# Patient Record
Sex: Female | Born: 1935 | Race: White | Hispanic: No | Marital: Married
Health system: Southern US, Community
[De-identification: ages and names within clinical notes are randomized; demographics above are authoritative.]

---

## 2003-03-29 ENCOUNTER — Encounter: Admission: RE | Admit: 2003-03-29 | Discharge: 2003-03-29 | Payer: Self-pay | Admitting: Internal Medicine

## 2003-03-29 ENCOUNTER — Encounter: Payer: Self-pay | Admitting: Internal Medicine

## 2005-12-06 ENCOUNTER — Emergency Department (HOSPITAL_COMMUNITY): Admission: EM | Admit: 2005-12-06 | Discharge: 2005-12-06 | Payer: Self-pay | Admitting: Family Medicine

## 2009-05-17 ENCOUNTER — Encounter: Admission: RE | Admit: 2009-05-17 | Discharge: 2009-05-17 | Payer: Self-pay | Admitting: Surgery

## 2009-05-21 ENCOUNTER — Encounter: Admission: RE | Admit: 2009-05-21 | Discharge: 2009-05-21 | Payer: Self-pay | Admitting: Surgery

## 2009-11-27 ENCOUNTER — Emergency Department (HOSPITAL_COMMUNITY): Admission: EM | Admit: 2009-11-27 | Discharge: 2009-11-27 | Payer: Self-pay | Admitting: Family Medicine

## 2009-12-07 ENCOUNTER — Emergency Department (HOSPITAL_COMMUNITY): Admission: EM | Admit: 2009-12-07 | Discharge: 2009-12-07 | Payer: Self-pay | Admitting: Emergency Medicine

## 2011-06-15 ENCOUNTER — Emergency Department (HOSPITAL_COMMUNITY): Payer: Medicare Other

## 2011-06-15 ENCOUNTER — Inpatient Hospital Stay (INDEPENDENT_AMBULATORY_CARE_PROVIDER_SITE_OTHER)
Admission: RE | Admit: 2011-06-15 | Discharge: 2011-06-15 | Disposition: A | Discharge status: Home / Self Care | Payer: Medicare Other | Source: Ambulatory Visit | Attending: Emergency Medicine | Admitting: Emergency Medicine

## 2011-06-15 ENCOUNTER — Emergency Department (HOSPITAL_COMMUNITY)
Admission: EM | Admit: 2011-06-15 | Discharge: 2011-06-15 | Disposition: A | Discharge status: Home / Self Care | Payer: Medicare Other | Attending: Emergency Medicine | Admitting: Emergency Medicine

## 2011-06-15 DIAGNOSIS — Z79899 Other long term (current) drug therapy: Secondary | ICD-10-CM | POA: Insufficient documentation

## 2011-06-15 DIAGNOSIS — R51 Headache: Secondary | ICD-10-CM | POA: Insufficient documentation

## 2011-06-15 DIAGNOSIS — W19XXXA Unspecified fall, initial encounter: Secondary | ICD-10-CM

## 2011-06-15 DIAGNOSIS — M810 Age-related osteoporosis without current pathological fracture: Secondary | ICD-10-CM | POA: Insufficient documentation

## 2011-06-15 DIAGNOSIS — S0180XA Unspecified open wound of other part of head, initial encounter: Secondary | ICD-10-CM

## 2011-06-15 DIAGNOSIS — E039 Hypothyroidism, unspecified: Secondary | ICD-10-CM | POA: Insufficient documentation

## 2011-06-15 DIAGNOSIS — S0990XA Unspecified injury of head, initial encounter: Secondary | ICD-10-CM | POA: Insufficient documentation

## 2011-06-15 DIAGNOSIS — W010XXA Fall on same level from slipping, tripping and stumbling without subsequent striking against object, initial encounter: Secondary | ICD-10-CM | POA: Insufficient documentation

## 2011-06-20 ENCOUNTER — Inpatient Hospital Stay (INDEPENDENT_AMBULATORY_CARE_PROVIDER_SITE_OTHER)
Admission: RE | Admit: 2011-06-20 | Discharge: 2011-06-20 | Disposition: A | Discharge status: Home / Self Care | Payer: Medicare Other | Source: Ambulatory Visit | Attending: Family Medicine | Admitting: Family Medicine

## 2011-06-20 DIAGNOSIS — S0180XA Unspecified open wound of other part of head, initial encounter: Secondary | ICD-10-CM

## 2016-12-05 ENCOUNTER — Inpatient Hospital Stay (HOSPITAL_COMMUNITY)
Admission: EM | Admit: 2016-12-05 | Discharge: 2016-12-09 | DRG: 064 | Disposition: A | Discharge status: Hospice | Payer: Medicare Other | Attending: Internal Medicine | Admitting: Internal Medicine

## 2016-12-05 ENCOUNTER — Emergency Department (HOSPITAL_COMMUNITY): Payer: Medicare Other

## 2016-12-05 DIAGNOSIS — I612 Nontraumatic intracerebral hemorrhage in hemisphere, unspecified: Secondary | ICD-10-CM | POA: Diagnosis not present

## 2016-12-05 DIAGNOSIS — R29810 Facial weakness: Secondary | ICD-10-CM | POA: Diagnosis not present

## 2016-12-05 DIAGNOSIS — Z885 Allergy status to narcotic agent status: Secondary | ICD-10-CM

## 2016-12-05 DIAGNOSIS — Z79899 Other long term (current) drug therapy: Secondary | ICD-10-CM

## 2016-12-05 DIAGNOSIS — Z515 Encounter for palliative care: Secondary | ICD-10-CM

## 2016-12-05 DIAGNOSIS — Z66 Do not resuscitate: Secondary | ICD-10-CM | POA: Diagnosis present

## 2016-12-05 DIAGNOSIS — I639 Cerebral infarction, unspecified: Secondary | ICD-10-CM | POA: Diagnosis present

## 2016-12-05 DIAGNOSIS — I615 Nontraumatic intracerebral hemorrhage, intraventricular: Principal | ICD-10-CM | POA: Diagnosis present

## 2016-12-05 DIAGNOSIS — G8194 Hemiplegia, unspecified affecting left nondominant side: Secondary | ICD-10-CM | POA: Diagnosis present

## 2016-12-05 DIAGNOSIS — E039 Hypothyroidism, unspecified: Secondary | ICD-10-CM

## 2016-12-05 DIAGNOSIS — S0689AA Other specified intracranial injury with loss of consciousness status unknown, initial encounter: Secondary | ICD-10-CM | POA: Diagnosis present

## 2016-12-05 DIAGNOSIS — Y92007 Garden or yard of unspecified non-institutional (private) residence as the place of occurrence of the external cause: Secondary | ICD-10-CM

## 2016-12-05 DIAGNOSIS — I619 Nontraumatic intracerebral hemorrhage, unspecified: Secondary | ICD-10-CM | POA: Diagnosis present

## 2016-12-05 DIAGNOSIS — I10 Essential (primary) hypertension: Secondary | ICD-10-CM | POA: Diagnosis present

## 2016-12-05 DIAGNOSIS — R29716 NIHSS score 16: Secondary | ICD-10-CM | POA: Diagnosis present

## 2016-12-05 DIAGNOSIS — W1830XA Fall on same level, unspecified, initial encounter: Secondary | ICD-10-CM | POA: Diagnosis present

## 2016-12-05 DIAGNOSIS — S06369A Traumatic hemorrhage of cerebrum, unspecified, with loss of consciousness of unspecified duration, initial encounter: Secondary | ICD-10-CM

## 2016-12-05 LAB — DIFFERENTIAL
Basophils Absolute: 0 10*3/uL (ref 0.0–0.1)
Basophils Relative: 0 %
EOS PCT: 0 %
Eosinophils Absolute: 0 10*3/uL (ref 0.0–0.7)
LYMPHS ABS: 0.7 10*3/uL (ref 0.7–4.0)
LYMPHS PCT: 7 %
MONO ABS: 0.5 10*3/uL (ref 0.1–1.0)
MONOS PCT: 5 %
Neutro Abs: 9.3 10*3/uL — ABNORMAL HIGH (ref 1.7–7.7)
Neutrophils Relative %: 88 %

## 2016-12-05 LAB — COMPREHENSIVE METABOLIC PANEL
ALK PHOS: 51 U/L (ref 38–126)
ALT: 14 U/L (ref 14–54)
ANION GAP: 11 (ref 5–15)
AST: 28 U/L (ref 15–41)
Albumin: 4.1 g/dL (ref 3.5–5.0)
BILIRUBIN TOTAL: 0.7 mg/dL (ref 0.3–1.2)
BUN: 11 mg/dL (ref 6–20)
CALCIUM: 9.7 mg/dL (ref 8.9–10.3)
CO2: 25 mmol/L (ref 22–32)
Chloride: 98 mmol/L — ABNORMAL LOW (ref 101–111)
Creatinine, Ser: 0.77 mg/dL (ref 0.44–1.00)
GLUCOSE: 146 mg/dL — AB (ref 65–99)
Potassium: 3.8 mmol/L (ref 3.5–5.1)
Sodium: 134 mmol/L — ABNORMAL LOW (ref 135–145)
TOTAL PROTEIN: 7 g/dL (ref 6.5–8.1)

## 2016-12-05 LAB — I-STAT CHEM 8, ED
BUN: 14 mg/dL (ref 6–20)
CALCIUM ION: 1.18 mmol/L (ref 1.15–1.40)
CHLORIDE: 97 mmol/L — AB (ref 101–111)
Creatinine, Ser: 0.7 mg/dL (ref 0.44–1.00)
GLUCOSE: 144 mg/dL — AB (ref 65–99)
HCT: 35 % — ABNORMAL LOW (ref 36.0–46.0)
Hemoglobin: 11.9 g/dL — ABNORMAL LOW (ref 12.0–15.0)
Potassium: 3.7 mmol/L (ref 3.5–5.1)
Sodium: 136 mmol/L (ref 135–145)
TCO2: 27 mmol/L (ref 0–100)

## 2016-12-05 LAB — CBC
HCT: 36.8 % (ref 36.0–46.0)
HEMOGLOBIN: 12.1 g/dL (ref 12.0–15.0)
MCH: 29.6 pg (ref 26.0–34.0)
MCHC: 32.9 g/dL (ref 30.0–36.0)
MCV: 90 fL (ref 78.0–100.0)
Platelets: 256 10*3/uL (ref 150–400)
RBC: 4.09 MIL/uL (ref 3.87–5.11)
RDW: 12.4 % (ref 11.5–15.5)
WBC: 10.6 10*3/uL — ABNORMAL HIGH (ref 4.0–10.5)

## 2016-12-05 LAB — APTT: aPTT: 28 seconds (ref 24–36)

## 2016-12-05 LAB — PROTIME-INR
INR: 0.99
PROTHROMBIN TIME: 13.1 s (ref 11.4–15.2)

## 2016-12-05 LAB — I-STAT TROPONIN, ED: Troponin i, poc: 0 ng/mL (ref 0.00–0.08)

## 2016-12-05 LAB — CBG MONITORING, ED: GLUCOSE-CAPILLARY: 137 mg/dL — AB (ref 65–99)

## 2016-12-05 MED ORDER — ACETAMINOPHEN 650 MG RE SUPP
650.0000 mg | Freq: Four times a day (QID) | RECTAL | Status: DC | PRN
  Administered 2016-12-06 – 2016-12-09 (×8): 650 mg via RECTAL
  Filled 2016-12-05 (×8): qty 1

## 2016-12-05 MED ORDER — MAGIC MOUTHWASH: 15.0000 mL | Freq: Four times a day (QID) | ORAL | Status: DC | PRN

## 2016-12-05 MED ORDER — SODIUM CHLORIDE 0.9 % IV SOLN: 250.0000 mL | INTRAVENOUS | Status: DC | PRN

## 2016-12-05 MED ORDER — NICARDIPINE HCL IN NACL 20-0.86 MG/200ML-% IV SOLN
INTRAVENOUS | Status: AC
  Filled 2016-12-05: qty 200

## 2016-12-05 MED ORDER — HALOPERIDOL 1 MG PO TABS: 0.5000 mg | ORAL_TABLET | ORAL | Status: DC | PRN

## 2016-12-05 MED ORDER — HALOPERIDOL LACTATE 5 MG/ML IJ SOLN: 0.5000 mg | INTRAMUSCULAR | Status: DC | PRN

## 2016-12-05 MED ORDER — LORAZEPAM 1 MG PO TABS: 1.0000 mg | ORAL_TABLET | ORAL | Status: DC | PRN

## 2016-12-05 MED ORDER — SODIUM CHLORIDE 0.9 % IV SOLN: 12.5000 mg | Freq: Four times a day (QID) | INTRAVENOUS | Status: DC | PRN

## 2016-12-05 MED ORDER — FENTANYL CITRATE (PF) 100 MCG/2ML IJ SOLN
12.5000 ug | Freq: Once | INTRAMUSCULAR | Status: AC
  Administered 2016-12-05: 12.5 ug via INTRAVENOUS
  Filled 2016-12-05: qty 2

## 2016-12-05 MED ORDER — ONDANSETRON HCL 4 MG/2ML IJ SOLN
4.0000 mg | Freq: Once | INTRAMUSCULAR | Status: AC
  Administered 2016-12-05: 4 mg via INTRAVENOUS
  Filled 2016-12-05: qty 2

## 2016-12-05 MED ORDER — HALOPERIDOL LACTATE 2 MG/ML PO CONC: 0.5000 mg | ORAL | Status: DC | PRN

## 2016-12-05 MED ORDER — ACETAMINOPHEN 325 MG PO TABS: 650.0000 mg | ORAL_TABLET | Freq: Four times a day (QID) | ORAL | Status: DC | PRN

## 2016-12-05 MED ORDER — LORAZEPAM 2 MG/ML IJ SOLN: 1.0000 mg | INTRAMUSCULAR | Status: DC | PRN

## 2016-12-05 MED ORDER — DIPHENHYDRAMINE HCL 50 MG/ML IJ SOLN: 12.5000 mg | INTRAMUSCULAR | Status: DC | PRN

## 2016-12-05 MED ORDER — SENNA 8.6 MG PO TABS: 1.0000 | ORAL_TABLET | Freq: Every evening | ORAL | Status: DC | PRN

## 2016-12-05 MED ORDER — TRAZODONE HCL 50 MG PO TABS: 25.0000 mg | ORAL_TABLET | Freq: Every evening | ORAL | Status: DC | PRN

## 2016-12-05 MED ORDER — POLYVINYL ALCOHOL 1.4 % OP SOLN: 1.0000 [drp] | Freq: Four times a day (QID) | OPHTHALMIC | Status: DC | PRN

## 2016-12-05 MED ORDER — ONDANSETRON HCL 4 MG/2ML IJ SOLN
4.0000 mg | INTRAMUSCULAR | Status: DC | PRN
  Administered 2016-12-06: 4 mg via INTRAVENOUS
  Filled 2016-12-05: qty 2

## 2016-12-05 MED ORDER — LABETALOL HCL 5 MG/ML IV SOLN
INTRAVENOUS | Status: AC
  Filled 2016-12-05: qty 4

## 2016-12-05 MED ORDER — SODIUM CHLORIDE 0.9% FLUSH: 3.0000 mL | INTRAVENOUS | Status: DC | PRN

## 2016-12-05 MED ORDER — SODIUM CHLORIDE 0.9% FLUSH
3.0000 mL | Freq: Two times a day (BID) | INTRAVENOUS | Status: DC
  Administered 2016-12-05 – 2016-12-09 (×8): 3 mL via INTRAVENOUS

## 2016-12-05 MED ORDER — LORAZEPAM 2 MG/ML PO CONC
1.0000 mg | ORAL | Status: DC | PRN
  Filled 2016-12-05: qty 0.5

## 2016-12-05 MED ORDER — ALBUTEROL SULFATE (2.5 MG/3ML) 0.083% IN NEBU: 2.5000 mg | INHALATION_SOLUTION | RESPIRATORY_TRACT | Status: DC | PRN

## 2016-12-05 MED ORDER — NICARDIPINE HCL IN NACL 20-0.86 MG/200ML-% IV SOLN
0.0000 mg/h | INTRAVENOUS | Status: DC
  Administered 2016-12-05: 5 mg/h via INTRAVENOUS

## 2016-12-05 MED ORDER — FENTANYL CITRATE (PF) 100 MCG/2ML IJ SOLN
12.5000 ug | INTRAMUSCULAR | Status: DC | PRN
  Administered 2016-12-05 – 2016-12-06 (×4): 12.5 ug via INTRAVENOUS
  Filled 2016-12-05 (×4): qty 2

## 2016-12-05 NOTE — Code Documentation (Signed)
82[facility] [id number].  Patient was LKW at 0900 when her neighbor witnessed her raking her leaves in the yard.  Patient was later found down in the yard by her niece.  EMS called and activated a code stroke for left sided deficits.  Stroke team at the bedside on patient arrival.  Labs drawn and patient cleared for CT by Dr. Jeraldine LootsLockwood.  Patient to CT with team.  CT showing large intraparenchymal hemorrhage on the right measures 5.7 x 4.5 x 8.1 cm. Estimated volume is 110 cm^3.  Significant mass effect with effacement of the sulci and right lateral ventricle and 14 mm right-to-left midline shift.  Intraventricular extension of hemorrhage.  Patient transported to ED A7.  BP assessed and found to be 180/72.  Labetalol 20mg  IVP ordered and given at 1445.  BP improved to 137/58.  BP monitored frequently with SBP goal <140 per Dr. Amada JupiterKirkpatrick.  BP increased to 154/72 at 1459 and Cardene gtt started at 5mg /hr per MD order.  BP monitored frequently and Cardene gtt titrated down, see MAR.  NIHSS 16, see documentation for details and code stroke times.  Patient with right gaze, left hemianopia, left facial droop and left hemiparesis on exam.  Family at the bedside and discussing treatment options with Dr. Amada JupiterKirkpatrick.  Family to conference room with MD to make decisions regarding plan of care.  BP continued to decrease and Cardene gtt stopped per MD.  Family decision to make patient comfort care.  Bedside handoff with ED RN Gladstone LighterAlecia.

## 2016-12-05 NOTE — ED Provider Notes (Signed)
[facility] DEPT Provider Note   CSN: [id number] Arrival date & time: 12/05/16  1423   An emergency department physician performed an initial assessment on this suspected stroke patient at [id number].  History   Chief Complaint No chief complaint on file.   HPI Deanna Ortiz is a 82 y.o. female.  HPI Patient was found in her garden. She had been outside for an unknown amount of time. Not suspected to be more than several hours. She was found face down. Patient was however responsive. She was able to answer questions. Patient had facial droop and decreased use of left extremities.  No past medical history on file.  Patient Active Problem List   Diagnosis Date Noted  . ICH (intracerebral hemorrhage) (HCC) 12/05/2016    No past surgical history on file.  OB History    No data available       Home Medications    Prior to Admission medications   Not on File    Family History No family history on file.  Social History Social History  Substance Use Topics  . Smoking status: Not on file  . Smokeless tobacco: Not on file  . Alcohol use Not on file     Allergies   Patient has no allergy information on record.   Review of Systems Review of Systems Not obtained due to patient condition. L5 caveat confusion.  Physical Exam Updated Vital Signs BP (!) 122/51 Comment: Simultaneous filing. User may not have seen previous data.  Pulse (!) 59 Comment: Simultaneous filing. User may not have seen previous data.  Resp 13 Comment: Simultaneous filing. User may not have seen previous data.  SpO2 97% Comment: Simultaneous filing. User may not have seen previous data.  Physical Exam  Constitutional:  Patient is awake and responding to some questions. She appears to be in pain placing her fingers to her right temple. No respiratory distress.  HENT:  Head: Normocephalic and atraumatic.  Mouth/Throat: Oropharynx is clear and moist.  Eyes:  Patient presents some right gaze  deviation  Neck:  The patient is a cervical collar.   Cardiovascular: Normal rate, regular rhythm, normal heart sounds and intact distal pulses.   Pulmonary/Chest: Effort normal and breath sounds normal.  Abdominal: Soft. She exhibits no distension.  Musculoskeletal: She exhibits no tenderness or deformity.  Neurological:  Patient is awake and answering some questions. She is spontaneously using her right upper extremity. Patient has diminished use or movement of left upper extremity.  Skin: Skin is warm and dry.     ED Treatments / Results  Labs (all labs ordered are listed, but only abnormal results are displayed) Labs Reviewed  CBC - Abnormal; Notable for the following:       Result Value   WBC 10.6 (*)    All other components within normal limits  DIFFERENTIAL - Abnormal; Notable for the following:    Neutro Abs 9.3 (*)    All other components within normal limits  COMPREHENSIVE METABOLIC PANEL - Abnormal; Notable for the following:    Sodium 134 (*)    Chloride 98 (*)    Glucose, Bld 146 (*)    All other components within normal limits  CBG MONITORING, ED - Abnormal; Notable for the following:    Glucose-Capillary 137 (*)    All other components within normal limits  I-STAT CHEM 8, ED - Abnormal; Notable for the following:    Chloride 97 (*)    Glucose, Bld 144 (*)  Hemoglobin 11.9 (*)    HCT 35.0 (*)    All other components within normal limits  PROTIME-INR  APTT  I-STAT TROPOININ, ED    EKG  EKG Interpretation None       Radiology Ct Head Wo Contrast  Result Date: 12/05/2016 CLINICAL DATA:  Found unresponsive. Code stroke. Last seen normal at 9 a.m. EXAM: CT HEAD WITHOUT CONTRAST CT CERVICAL SPINE WITHOUT CONTRAST TECHNIQUE: Multidetector CT imaging of the head and cervical spine was performed following the standard protocol without intravenous contrast. Multiplanar CT image reconstructions of the cervical spine were also generated. COMPARISON:  None.  FINDINGS: CT HEAD FINDINGS Brain: A large right hemispheric hemorrhage measures 5.7 x 4.5 x 8.1 cm (volume = 110 cm^3). There is significant mass effect with effacement of the sulci midline shift measures least 14 mm. There is effacement of the right lateral ventricle. Intraventricular hemorrhage is seen bilaterally. Vascular: Atherosclerotic calcifications are present within the cavernous internal carotid arteries bilaterally. There is no hyperdense vessel. Skull: Calvarium is intact. No focal lytic or blastic lesions are present. Sinuses/Orbits: The paranasal sinuses and mastoid air cells are clear. The patient is status post bilateral lens replacements. The globes and orbits are otherwise intact. CT CERVICAL SPINE FINDINGS Alignment: AP alignment is anatomic. Exaggerated cervical lordosis is present. Skull base and vertebrae: The skullbase is normal. The craniocervical junction is unremarkable. Vertebral body heights are maintained. No acute fracture or traumatic subluxation is present. Soft tissues and spinal canal: A dominant left thyroid nodule measures 1.2 cm. Atherosclerotic calcifications are present at the carotid bifurcations bilaterally. No significant adenopathy is present. Disc levels: Uncovertebral spurring an osseous foraminal narrowing is present bilaterally at C5-6 and C6-7. More mild uncovertebral spurring is present at C4-5. Upper chest: Biapical scarring is present. There is some bronchiectasis. The superior mediastinum is unremarkable. IMPRESSION: 1. Large intraparenchymal hemorrhage on the right measures 5.7 x 4.5 x 8.1 cm. Estimated volume is 110 cm^3. 2. Significant mass effect with effacement of the sulci and right lateral ventricle and 14 mm right-to-left midline shift. 3. Intraventricular extension of hemorrhage. 4. Generalized atrophy and white matter disease bilaterally is otherwise stable. 5. Multilevel degenerative changes in the cervical spine with exaggerated cervical lordosis.  These results were called by telephone at the time of interpretation on 12/05/2016 at 2:51 pm to Dr. Ritta SlotMCNEILL KIRKPATRICK , who verbally acknowledged these results. Electronically Signed   By: Marin Robertshristopher  Mattern M.D.   On: 12/05/2016 14:57   Ct Cervical Spine Wo Contrast  Result Date: 12/05/2016 CLINICAL DATA:  Found unresponsive. Code stroke. Last seen normal at 9 a.m. EXAM: CT HEAD WITHOUT CONTRAST CT CERVICAL SPINE WITHOUT CONTRAST TECHNIQUE: Multidetector CT imaging of the head and cervical spine was performed following the standard protocol without intravenous contrast. Multiplanar CT image reconstructions of the cervical spine were also generated. COMPARISON:  None. FINDINGS: CT HEAD FINDINGS Brain: A large right hemispheric hemorrhage measures 5.7 x 4.5 x 8.1 cm (volume = 110 cm^3). There is significant mass effect with effacement of the sulci midline shift measures least 14 mm. There is effacement of the right lateral ventricle. Intraventricular hemorrhage is seen bilaterally. Vascular: Atherosclerotic calcifications are present within the cavernous internal carotid arteries bilaterally. There is no hyperdense vessel. Skull: Calvarium is intact. No focal lytic or blastic lesions are present. Sinuses/Orbits: The paranasal sinuses and mastoid air cells are clear. The patient is status post bilateral lens replacements. The globes and orbits are otherwise intact. CT CERVICAL SPINE FINDINGS Alignment: AP  alignment is anatomic. Exaggerated cervical lordosis is present. Skull base and vertebrae: The skullbase is normal. The craniocervical junction is unremarkable. Vertebral body heights are maintained. No acute fracture or traumatic subluxation is present. Soft tissues and spinal canal: A dominant left thyroid nodule measures 1.2 cm. Atherosclerotic calcifications are present at the carotid bifurcations bilaterally. No significant adenopathy is present. Disc levels: Uncovertebral spurring an osseous foraminal  narrowing is present bilaterally at C5-6 and C6-7. More mild uncovertebral spurring is present at C4-5. Upper chest: Biapical scarring is present. There is some bronchiectasis. The superior mediastinum is unremarkable. IMPRESSION: 1. Large intraparenchymal hemorrhage on the right measures 5.7 x 4.5 x 8.1 cm. Estimated volume is 110 cm^3. 2. Significant mass effect with effacement of the sulci and right lateral ventricle and 14 mm right-to-left midline shift. 3. Intraventricular extension of hemorrhage. 4. Generalized atrophy and white matter disease bilaterally is otherwise stable. 5. Multilevel degenerative changes in the cervical spine with exaggerated cervical lordosis. These results were called by telephone at the time of interpretation on 12/05/2016 at 2:51 pm to Dr. Ritta Slot , who verbally acknowledged these results. Electronically Signed   By: Marin Roberts M.D.   On: 12/05/2016 14:57    Procedures Procedures (including critical care time)  Medications Ordered in ED Medications  labetalol (NORMODYNE,TRANDATE) 5 MG/ML injection (not administered)  niCARdipine in saline (CARDENE-IV) 20-0.86 MG/200ML-% infusion SOLN (not administered)  nicardipine (CARDENE) 20mg  in 0.86% saline IV infusion (0.1 mg/ml) (2 mg/hr Intravenous Rate/Dose Change 12/05/16 1521)  fentaNYL (SUBLIMAZE) injection 12.5 mcg (not administered)  ondansetron (ZOFRAN) injection 4 mg (not administered)     Initial Impression / Assessment and Plan / ED Course  I have reviewed the triage vital signs and the nursing notes.  Pertinent labs & imaging results that were available during my care of the patient were reviewed by me and considered in my medical decision making (see chart for details).  Clinical Course    Consult: Dr. Amada Jupiter has seen the patient as a code stroke.  Final Clinical Impressions(s) / ED Diagnoses   Final diagnoses:  Nontraumatic hemorrhage of right cerebral hemisphere [facility])     Patient was found down with neurologic deficit. Patient arrives code stroke. CT identifies large intracranial hemorrhage. At this time, patient does remain awake and protecting her airway. Dr. Amada Jupiter is discussing CODE STATUS and ongoing management with family members. New Prescriptions New Prescriptions   No medications on file     Arby Barrette, MD 12/05/16 1530

## 2016-12-05 NOTE — H&P (Signed)
[facility] History and Physical  Deanna Leuancy M Deshazo ZOX:[id number] DOB: [birth year] DOA: 12/05/2016  Referring physician:  PCP: Minda MeoARONSON,RICHARD A, MD   Chief Complaint: She fell in the garden. - dgtr  HPI: Deanna Ortiz is a 82 y.o. female  with past medical history of hypothyroidism presented to the emergency room with stroke. Patient was in her garden early in the day and fell over. She tried to get up but could not. She was outside for an unknown amount time possibly several hours before she was found by family member.   Review of Systems:  HPI limited by acuity of illness.   No past medical history on file. No past surgical history on file. Social History:  has no tobacco, alcohol, and drug history on file.  Allergies  Allergen Reactions  . Codeine Nausea And Vomiting  . Demerol [Meperidine] Nausea And Vomiting and Other (See Comments)    headache    No family history on file.   Prior to Admission medications   Medication Sig Start Date End Date Taking? Authorizing Provider  acetaminophen (TYLENOL) 500 MG tablet Take 500 mg by mouth daily as needed for pain.   Yes Historical Provider, MD  ibuprofen (ADVIL,MOTRIN) 200 MG tablet Take 400 mg by mouth every 6 (six) hours as needed for pain.   Yes Historical Provider, MD  levothyroxine (SYNTHROID, LEVOTHROID) 50 MCG tablet Take 50 mcg by mouth every evening. 01/02/16  Yes Historical Provider, MD  Calcium Carbonate-Vitamin D (OSCAL 500/200 D-3 PO) Take 1 tablet by mouth daily as needed (takes occasionally).     Historical Provider, MD   Physical Exam: Vitals:   12/05/16 1555 12/05/16 1600 12/05/16 1700 12/05/16 1800  BP: 137/61 103/68 154/84 135/62  Pulse: 67 66 73 61  Resp: 20 10 19 16   SpO2: 99% 98% 96% 99%    Wt Readings from Last 3 Encounters:  No data found for Wt    General:  Appears calm and comfortable Eyes:  PERRL, EOMI, normal lids, iris ENT:  grossly normal hearing, lips & tongue Neck:  no LAD, masses or  thyromegaly Cardiovascular:  RRR, no m/r/g. No LE edema.  Respiratory:  CTA bilaterally, no w/r/r. Normal respiratory effort. Abdomen:  soft, ntnd Skin:  no rash or induration seen on limited exam Musculoskeletal:  grossly normal tone BUE/BLE Psychiatric:  grossly normal mood and affect, speech fluent and appropriate Neurologic:  CN 2-12 grossly intact, moves all extremities in coordinated fashion. Right sided weakness and facial droop.          Labs on Admission:  Basic Metabolic Panel:  Recent Labs Lab 12/05/16 1427 12/05/16 1430  NA 134* 136  K 3.8 3.7  CL 98* 97*  CO2 25  --   GLUCOSE 146* 144*  BUN 11 14  CREATININE 0.77 0.70  CALCIUM 9.7  --    Liver Function Tests:  Recent Labs Lab 12/05/16 1427  AST 28  ALT 14  ALKPHOS 51  BILITOT 0.7  PROT 7.0  ALBUMIN 4.1   No results for input(s): LIPASE, AMYLASE in the last 168 hours. No results for input(s): AMMONIA in the last 168 hours. CBC:  Recent Labs Lab 12/05/16 1427 12/05/16 1430  WBC 10.6*  --   NEUTROABS 9.3*  --   HGB 12.1 11.9*  HCT 36.8 35.0*  MCV 90.0  --   PLT 256  --    Cardiac Enzymes: No results for input(s): CKTOTAL, CKMB, CKMBINDEX, TROPONINI in the last 168 hours.  BNP (last 3 results) No results for input(s): BNP in the last 8760 hours.  ProBNP (last 3 results) No results for input(s): PROBNP in the last 8760 hours.   Creatinine clearance cannot be calculated (Unknown ideal weight.)  CBG:  Recent Labs Lab 12/05/16 1428  GLUCAP 137*    Radiological Exams on Admission: Ct Head Wo Contrast  Result Date: 12/05/2016 CLINICAL DATA:  Found unresponsive. Code stroke. Last seen normal at 9 a.m. EXAM: CT HEAD WITHOUT CONTRAST CT CERVICAL SPINE WITHOUT CONTRAST TECHNIQUE: Multidetector CT imaging of the head and cervical spine was performed following the standard protocol without intravenous contrast. Multiplanar CT image reconstructions of the cervical spine were also generated.  COMPARISON:  None. FINDINGS: CT HEAD FINDINGS Brain: A large right hemispheric hemorrhage measures 5.7 x 4.5 x 8.1 cm (volume = 110 cm^3). There is significant mass effect with effacement of the sulci midline shift measures least 14 mm. There is effacement of the right lateral ventricle. Intraventricular hemorrhage is seen bilaterally. Vascular: Atherosclerotic calcifications are present within the cavernous internal carotid arteries bilaterally. There is no hyperdense vessel. Skull: Calvarium is intact. No focal lytic or blastic lesions are present. Sinuses/Orbits: The paranasal sinuses and mastoid air cells are clear. The patient is status post bilateral lens replacements. The globes and orbits are otherwise intact. CT CERVICAL SPINE FINDINGS Alignment: AP alignment is anatomic. Exaggerated cervical lordosis is present. Skull base and vertebrae: The skullbase is normal. The craniocervical junction is unremarkable. Vertebral body heights are maintained. No acute fracture or traumatic subluxation is present. Soft tissues and spinal canal: A dominant left thyroid nodule measures 1.2 cm. Atherosclerotic calcifications are present at the carotid bifurcations bilaterally. No significant adenopathy is present. Disc levels: Uncovertebral spurring an osseous foraminal narrowing is present bilaterally at C5-6 and C6-7. More mild uncovertebral spurring is present at C4-5. Upper chest: Biapical scarring is present. There is some bronchiectasis. The superior mediastinum is unremarkable. IMPRESSION: 1. Large intraparenchymal hemorrhage on the right measures 5.7 x 4.5 x 8.1 cm. Estimated volume is 110 cm^3. 2. Significant mass effect with effacement of the sulci and right lateral ventricle and 14 mm right-to-left midline shift. 3. Intraventricular extension of hemorrhage. 4. Generalized atrophy and white matter disease bilaterally is otherwise stable. 5. Multilevel degenerative changes in the cervical spine with exaggerated  cervical lordosis. These results were called by telephone at the time of interpretation on 12/05/2016 at 2:51 pm to Dr. Ritta SlotMCNEILL KIRKPATRICK , who verbally acknowledged these results. Electronically Signed   By: Marin Robertshristopher  Mattern M.D.   On: 12/05/2016 14:57   Ct Cervical Spine Wo Contrast  Result Date: 12/05/2016 CLINICAL DATA:  Found unresponsive. Code stroke. Last seen normal at 9 a.m. EXAM: CT HEAD WITHOUT CONTRAST CT CERVICAL SPINE WITHOUT CONTRAST TECHNIQUE: Multidetector CT imaging of the head and cervical spine was performed following the standard protocol without intravenous contrast. Multiplanar CT image reconstructions of the cervical spine were also generated. COMPARISON:  None. FINDINGS: CT HEAD FINDINGS Brain: A large right hemispheric hemorrhage measures 5.7 x 4.5 x 8.1 cm (volume = 110 cm^3). There is significant mass effect with effacement of the sulci midline shift measures least 14 mm. There is effacement of the right lateral ventricle. Intraventricular hemorrhage is seen bilaterally. Vascular: Atherosclerotic calcifications are present within the cavernous internal carotid arteries bilaterally. There is no hyperdense vessel. Skull: Calvarium is intact. No focal lytic or blastic lesions are present. Sinuses/Orbits: The paranasal sinuses and mastoid air cells are clear. The patient is status post  bilateral lens replacements. The globes and orbits are otherwise intact. CT CERVICAL SPINE FINDINGS Alignment: AP alignment is anatomic. Exaggerated cervical lordosis is present. Skull base and vertebrae: The skullbase is normal. The craniocervical junction is unremarkable. Vertebral body heights are maintained. No acute fracture or traumatic subluxation is present. Soft tissues and spinal canal: A dominant left thyroid nodule measures 1.2 cm. Atherosclerotic calcifications are present at the carotid bifurcations bilaterally. No significant adenopathy is present. Disc levels: Uncovertebral spurring  an osseous foraminal narrowing is present bilaterally at C5-6 and C6-7. More mild uncovertebral spurring is present at C4-5. Upper chest: Biapical scarring is present. There is some bronchiectasis. The superior mediastinum is unremarkable. IMPRESSION: 1. Large intraparenchymal hemorrhage on the right measures 5.7 x 4.5 x 8.1 cm. Estimated volume is 110 cm^3. 2. Significant mass effect with effacement of the sulci and right lateral ventricle and 14 mm right-to-left midline shift. 3. Intraventricular extension of hemorrhage. 4. Generalized atrophy and white matter disease bilaterally is otherwise stable. 5. Multilevel degenerative changes in the cervical spine with exaggerated cervical lordosis. These results were called by telephone at the time of interpretation on 12/05/2016 at 2:51 pm to Dr. Ritta Slot , who verbally acknowledged these results. Electronically Signed   By: Marin Roberts M.D.   On: 12/05/2016 14:57    EKG: Independently reviewed. Normal sinus rhythm, no STEMI.  Assessment/Plan Active Problems:   ICH (intracerebral hemorrhage) (HCC)   Intracranial hematoma (HCC)  Neuro onboard Patient made comfort care Comfort care order set use Suspect death is imminent  Code Status: DNR  DVT Prophylaxis: none Family Communication: dgtr & son at bedside Disposition Plan: Pending Improvement  Status: inpt med surg  Haydee Salter, MD Family Medicine [facility] [email or web address] Password [email or web address]

## 2016-12-05 NOTE — Consult Note (Signed)
Neurology Consultation Reason for Consult: ICH Referring Physician: Pfeiffer  CC: ICH  History is obtained from:patient  HPI: Deanna Ortiz M Papadakis is a 82 y.o. female with no significant past medical history who presents with acute onset left hemiparesis. She was seen well at 9am, then found down by a neighbor. She was brought in as a code stroke and found to have a large(110 cc) hemorrhage on head CT. She was hypertensive to the 180s, but after labetalol and low dose cardene, come down quickly to the 110s - 120s.   Initially planned for ICU admission, but after extensive discussion with family, she was made full comfort care.   LKW: 9 am tpa given?: no, ich   ROS: unable to obtain due to ams.   PMH: none  FHx: unable to obtain due to AMS  Social History: lives alone   Exam: Current vital signs: BP 117/58   Pulse (!) 51   Resp 16   SpO2 97% Comment: Simultaneous filing. User may not have seen previous data. Vital signs in last 24 hours: Pulse Rate:  [51-71] 51 (12/22 1535) Resp:  [13-20] 16 (12/22 1535) BP: (117-180)/(51-72) 117/58 (12/22 1535) SpO2:  [96 %-98 %] 97 % (12/22 1515)   Physical Exam  Constitutional: Appears elderly.  Psych: Affect appropriate to situation Eyes: No scleral injection HENT: No OP obstrucion Head: Normocephalic.  Cardiovascular: Normal rate and regular rhythm.  Respiratory: Effort normal  GI: Soft.  No distension. There is no tenderness.  Skin: WDI  Neuro: Mental Status: Patient is lethargic, but is able to answer questions and follow commands. Has left neglect Cranial Nerves: II: L hemianopia Pupils are equal, round, and reactive to light.   III,IV, VI: Right gaze deviation.  V: Facial sensation is decreased on left VII: Facial movement is decreased on left.  Tongue with left ward deviation.  Motor: Tone is increased throughout the left side.  Sensory: Sensation is decreased on the left. She has neglect.  Cerebellar: FNF and HKS are  intact bilaterally   I have reviewed labs in epic and the results pertinent to this consultation are: Cbc, cmp - unremarkable  I have reviewed the images obtained:CT head - large(110 cc) intraparenchymal hemorrhage.   Impression: 82 yo F with large IPH. I think that with the size of her ICH, there is a relatively small chance of survival and if she does survive, she will be severely disabled. That being said, even with comfort only measures, there is some chance of survival given that she has significant atrophy. ICH scoer would suggest a 72% mortality, but I think it is higher given the size of her hemorrhage.   Recommendations: 1) Agree that she is appropriate for full comfort care.  2) Stroke team will follow.    Ritta SlotMcNeill Albertha Beattie, MD [facility] [phone]  If 7pm- 7am, please page neurology on call as listed in AMION.

## 2016-12-06 DIAGNOSIS — I10 Essential (primary) hypertension: Secondary | ICD-10-CM | POA: Diagnosis present

## 2016-12-06 DIAGNOSIS — I612 Nontraumatic intracerebral hemorrhage in hemisphere, unspecified: Secondary | ICD-10-CM | POA: Diagnosis not present

## 2016-12-06 DIAGNOSIS — R29810 Facial weakness: Secondary | ICD-10-CM | POA: Diagnosis present

## 2016-12-06 DIAGNOSIS — Z885 Allergy status to narcotic agent status: Secondary | ICD-10-CM | POA: Diagnosis not present

## 2016-12-06 DIAGNOSIS — Z66 Do not resuscitate: Secondary | ICD-10-CM | POA: Diagnosis present

## 2016-12-06 DIAGNOSIS — I639 Cerebral infarction, unspecified: Secondary | ICD-10-CM | POA: Diagnosis present

## 2016-12-06 DIAGNOSIS — R29716 NIHSS score 16: Secondary | ICD-10-CM | POA: Diagnosis present

## 2016-12-06 DIAGNOSIS — Z79899 Other long term (current) drug therapy: Secondary | ICD-10-CM | POA: Diagnosis not present

## 2016-12-06 DIAGNOSIS — G8194 Hemiplegia, unspecified affecting left nondominant side: Secondary | ICD-10-CM | POA: Diagnosis present

## 2016-12-06 DIAGNOSIS — I615 Nontraumatic intracerebral hemorrhage, intraventricular: Secondary | ICD-10-CM | POA: Diagnosis present

## 2016-12-06 DIAGNOSIS — W1830XA Fall on same level, unspecified, initial encounter: Secondary | ICD-10-CM | POA: Diagnosis present

## 2016-12-06 DIAGNOSIS — Y92007 Garden or yard of unspecified non-institutional (private) residence as the place of occurrence of the external cause: Secondary | ICD-10-CM | POA: Diagnosis not present

## 2016-12-06 DIAGNOSIS — Z515 Encounter for palliative care: Secondary | ICD-10-CM | POA: Diagnosis present

## 2016-12-06 DIAGNOSIS — E039 Hypothyroidism, unspecified: Secondary | ICD-10-CM | POA: Diagnosis present

## 2016-12-06 MED ORDER — FENTANYL CITRATE (PF) 100 MCG/2ML IJ SOLN
25.0000 ug | INTRAMUSCULAR | Status: DC | PRN
  Administered 2016-12-06 – 2016-12-09 (×16): 12.5 ug via INTRAVENOUS
  Filled 2016-12-06 (×15): qty 2

## 2016-12-06 MED ORDER — WHITE PETROLATUM GEL
Status: AC
  Administered 2016-12-06: 1
  Filled 2016-12-06: qty 1

## 2016-12-06 NOTE — Progress Notes (Signed)
STROKE TEAM PROGRESS NOTE   HISTORY OF PRESENT ILLNESS (per record) Deanna Ortiz is a 82 y.o. female with no significant past medical history who presents with acute onset left hemiparesis. She was seen well at 9am, then found down by a neighbor. She was brought in as a code stroke and found to have a large(110 cc) hemorrhage on head CT. She was hypertensive to the 180s, but after labetalol and low dose cardene, come down quickly to the 110s - 120s.   Initially planned for ICU admission, but after extensive discussion with family, she was made full comfort care.   LKW: 9 am tpa given?: no, ich   SUBJECTIVE (INTERVAL HISTORY) Her family is at bedside, she is comfort care   OBJECTIVE Temp:  [97.5 F (36.4 C)-98.4 F (36.9 C)] 97.5 F (36.4 C) (12/23 1513) Pulse Rate:  [51-90] 90 (12/23 1513) Resp:  [10-20] 18 (12/23 0000) BP: (103-154)/(52-84) 152/59 (12/23 1513) SpO2:  [96 %-99 %] 96 % (12/23 1513)  CBC:   Recent Labs Lab 12/05/16 1427 12/05/16 1430  WBC 10.6*  --   NEUTROABS 9.3*  --   HGB 12.1 11.9*  HCT 36.8 35.0*  MCV 90.0  --   PLT 256  --     Basic Metabolic Panel:   Recent Labs Lab 12/05/16 1427 12/05/16 1430  NA 134* 136  K 3.8 3.7  CL 98* 97*  CO2 25  --   GLUCOSE 146* 144*  BUN 11 14  CREATININE 0.77 0.70  CALCIUM 9.7  --     Lipid Panel: No results found for: CHOL, TRIG, HDL, CHOLHDL, VLDL, LDLCALC HgbA1c: No results found for: HGBA1C Urine Drug Screen: No results found for: LABOPIA, COCAINSCRNUR, LABBENZ, AMPHETMU, THCU, LABBARB    IMAGING  Ct Head Wo Contrast 12/05/2016 1. Large intraparenchymal hemorrhage on the right measures 5.7 x 4.5 x 8.1 cm. Estimated volume is 110 cm^3.  2. Significant mass effect with effacement of the sulci and right lateral ventricle and 14 mm right-to-left midline shift.  3. Intraventricular extension of hemorrhage.  4. Generalized atrophy and white matter disease bilaterally is otherwise stable.     Ct  Cervical Spine Wo Contrast 12/05/2016 Multilevel degenerative changes in the cervical spine with exaggerated cervical lordosis.    PHYSICAL EXAM Constitutional: Appears elderly.  Psych: Affect appropriate to situation Eyes: No scleral injection HENT: No OP obstrucion Head: Normocephalic.  Cardiovascular: Normal rate and regular rhythm.  Respiratory: Effort normal  GI: Soft.  No distension. There is no tenderness.  Skin: WDI  Neuro: Mental Status: Patient is lethargic, sedated, comfort measures following for family comfort  ASSESSMENT/PLAN Ms. Deanna Ortiz is a 82 y.o. female with history of hypothyroidism presenting with left hemiparesis. She did not receive IV t-PA due to ICH.  Large intraparenchymal hemorrhage:  Non-dominant - secondary to hypertension.  Resultant: prognosis is poor, large right intraparenchymal hemorrhage with intraventricular extension.  MRI - not performed.  MRA - not performed.  Carotid Doppler - not indicated.  2D Echo - not indicated.  LDL - not indicated.  HgbA1c - not indicated.  VTE prophylaxis - comfort measures only. Diet Heart Room service appropriate? Yes; Fluid consistency: Thin  No antithrombotic prior to admission, now on No antithrombotic secondary to intraparenchymal hemorrhage.   Other Stroke Risk Factors  Advanced age  Hypertension   PLAN  Comfort care measures only  The stroke team will sign off at this time. Please call if we can be of further  service.    Hospital day # 1   Personally examined patient and images, and have participated in and made any corrections needed to history, physical, neuro exam,assessment and plan as stated above.  I have personally obtained the history, evaluated lab date, reviewed imaging studies and agree with radiology interpretations.    Deanna DeanAntonia Joell Buerger, MD    To contact Stroke Continuity provider, please refer to [email or web address]. After hours, contact General Neurology

## 2016-12-06 NOTE — Progress Notes (Signed)
PROGRESS NOTE    Deanna Ortiz  ZOX:[id number] DOB: 03-27-1925 DOA: 12/05/2016 PCP: Minda Meo, MD   No chief complaint on file.    Brief Narrative:  HPI on 12/05/2016 by Dr. Eliezer Bottom TIAIRA Ortiz is a 82 y.o. female  with past medical history of hypothyroidism presented to the emergency room with stroke. Patient was in her garden early in the day and fell over. She tried to get up but could not. She was outside for an unknown amount time possibly several hours before she was found by family member. Assessment & Plan   Intraparenchymal hemorrhage -CT head showed large intraparenchymal hemorrhage on the right measuring 5.7 x 4.5 x 8.1 cm, significant mass effect with effacement -Patient initially called in as a code stroke and was going to be admitted to ICU however patient was transitioned to comfort care. -Neurology consulted and appreciated -Palliative care consulted and appreciated -Continue pain control, anti-anxiolytics as needed -Discussed possible home with hospice versus residential hospice with patient's family members  Hypothyroidism -Medications on hold as patient has been transitioned to comfort care.  DVT Prophylaxis  None  Code Status: DNR  Family Communication: Family at bedside  Disposition Plan: Currently in observatin  Consultants Neurology Palliative care  Procedures  None  Antibiotics   Anti-infectives    None      Subjective:   Bonney Leitz seen and examined today.  Currently sleeping, does not follow commands.    Objective:   Vitals:   12/05/16 1800 12/05/16 1900 12/05/16 2000 12/06/16 0000  BP: 135/62 139/60 139/64 134/67  Pulse: 61 65 64 67  Resp: 16 18 18 18   Temp:   98.2 F (36.8 C) 98.4 F (36.9 C)  TempSrc:   Axillary Oral  SpO2: 99% 97% 97% 97%    Intake/Output Summary (Last 24 hours) at 12/06/16 1301 Last data filed at 12/06/16 0500  Gross per 24 hour  Intake                3 ml  Output                0 ml  Net                 3 ml   There were no vitals filed for this visit.  Exam  General: Well developed, well nourished, NAD, appears stated age  HEENT: NCAT,PERRLA, mucous membranes moist.   Cardiovascular: S1 S2 auscultated, no rubs, murmurs or gallops. Regular rate and rhythm.  Respiratory: Clear to auscultation bilaterally with equal chest rise  Abdomen: Soft, nontender, nondistended, + bowel sounds  Extremities: warm dry without cyanosis clubbing or edema  Neuro: unable to fully assess  Data Reviewed: I have personally reviewed following labs and imaging studies  CBC:  Recent Labs Lab 12/05/16 1427 12/05/16 1430  WBC 10.6*  --   NEUTROABS 9.3*  --   HGB 12.1 11.9*  HCT 36.8 35.0*  MCV 90.0  --   PLT 256  --    Basic Metabolic Panel:  Recent Labs Lab 12/05/16 1427 12/05/16 1430  NA 134* 136  K 3.8 3.7  CL 98* 97*  CO2 25  --   GLUCOSE 146* 144*  BUN 11 14  CREATININE 0.77 0.70  CALCIUM 9.7  --    GFR: CrCl cannot be calculated (Unknown ideal weight.). Liver Function Tests:  Recent Labs Lab 12/05/16 1427  AST 28  ALT 14  ALKPHOS 51  BILITOT 0.7  PROT  7.0  ALBUMIN 4.1   No results for input(s): LIPASE, AMYLASE in the last 168 hours. No results for input(s): AMMONIA in the last 168 hours. Coagulation Profile:  Recent Labs Lab 12/05/16 1427  INR 0.99   Cardiac Enzymes: No results for input(s): CKTOTAL, CKMB, CKMBINDEX, TROPONINI in the last 168 hours. BNP (last 3 results) No results for input(s): PROBNP in the last 8760 hours. HbA1C: No results for input(s): HGBA1C in the last 72 hours. CBG:  Recent Labs Lab 12/05/16 1428  GLUCAP 137*   Lipid Profile: No results for input(s): CHOL, HDL, LDLCALC, TRIG, CHOLHDL, LDLDIRECT in the last 72 hours. Thyroid Function Tests: No results for input(s): TSH, T4TOTAL, FREET4, T3FREE, THYROIDAB in the last 72 hours. Anemia Panel: No results for input(s): VITAMINB12, FOLATE, FERRITIN, TIBC, IRON,  RETICCTPCT in the last 72 hours. Urine analysis: No results found for: COLORURINE, APPEARANCEUR, LABSPEC, PHURINE, GLUCOSEU, HGBUR, BILIRUBINUR, KETONESUR, PROTEINUR, UROBILINOGEN, NITRITE, LEUKOCYTESUR Sepsis Labs: @LABRCNTIP (procalcitonin:4,lacticidven:4)  )No results found for this or any previous visit (from the past 240 hour(s)).    Radiology Studies: Ct Head Wo Contrast  Result Date: 12/05/2016 CLINICAL DATA:  Found unresponsive. Code stroke. Last seen normal at 9 a.m. EXAM: CT HEAD WITHOUT CONTRAST CT CERVICAL SPINE WITHOUT CONTRAST TECHNIQUE: Multidetector CT imaging of the head and cervical spine was performed following the standard protocol without intravenous contrast. Multiplanar CT image reconstructions of the cervical spine were also generated. COMPARISON:  None. FINDINGS: CT HEAD FINDINGS Brain: A large right hemispheric hemorrhage measures 5.7 x 4.5 x 8.1 cm (volume = 110 cm^3). There is significant mass effect with effacement of the sulci midline shift measures least 14 mm. There is effacement of the right lateral ventricle. Intraventricular hemorrhage is seen bilaterally. Vascular: Atherosclerotic calcifications are present within the cavernous internal carotid arteries bilaterally. There is no hyperdense vessel. Skull: Calvarium is intact. No focal lytic or blastic lesions are present. Sinuses/Orbits: The paranasal sinuses and mastoid air cells are clear. The patient is status post bilateral lens replacements. The globes and orbits are otherwise intact. CT CERVICAL SPINE FINDINGS Alignment: AP alignment is anatomic. Exaggerated cervical lordosis is present. Skull base and vertebrae: The skullbase is normal. The craniocervical junction is unremarkable. Vertebral body heights are maintained. No acute fracture or traumatic subluxation is present. Soft tissues and spinal canal: A dominant left thyroid nodule measures 1.2 cm. Atherosclerotic calcifications are present at the carotid  bifurcations bilaterally. No significant adenopathy is present. Disc levels: Uncovertebral spurring an osseous foraminal narrowing is present bilaterally at C5-6 and C6-7. More mild uncovertebral spurring is present at C4-5. Upper chest: Biapical scarring is present. There is some bronchiectasis. The superior mediastinum is unremarkable. IMPRESSION: 1. Large intraparenchymal hemorrhage on the right measures 5.7 x 4.5 x 8.1 cm. Estimated volume is 110 cm^3. 2. Significant mass effect with effacement of the sulci and right lateral ventricle and 14 mm right-to-left midline shift. 3. Intraventricular extension of hemorrhage. 4. Generalized atrophy and white matter disease bilaterally is otherwise stable. 5. Multilevel degenerative changes in the cervical spine with exaggerated cervical lordosis. These results were called by telephone at the time of interpretation on 12/05/2016 at 2:51 pm to Dr. Ritta SlotMCNEILL KIRKPATRICK , who verbally acknowledged these results. Electronically Signed   By: Marin Robertshristopher  Mattern M.D.   On: 12/05/2016 14:57   Ct Cervical Spine Wo Contrast  Result Date: 12/05/2016 CLINICAL DATA:  Found unresponsive. Code stroke. Last seen normal at 9 a.m. EXAM: CT HEAD WITHOUT CONTRAST CT CERVICAL SPINE WITHOUT CONTRAST  TECHNIQUE: Multidetector CT imaging of the head and cervical spine was performed following the standard protocol without intravenous contrast. Multiplanar CT image reconstructions of the cervical spine were also generated. COMPARISON:  None. FINDINGS: CT HEAD FINDINGS Brain: A large right hemispheric hemorrhage measures 5.7 x 4.5 x 8.1 cm (volume = 110 cm^3). There is significant mass effect with effacement of the sulci midline shift measures least 14 mm. There is effacement of the right lateral ventricle. Intraventricular hemorrhage is seen bilaterally. Vascular: Atherosclerotic calcifications are present within the cavernous internal carotid arteries bilaterally. There is no hyperdense  vessel. Skull: Calvarium is intact. No focal lytic or blastic lesions are present. Sinuses/Orbits: The paranasal sinuses and mastoid air cells are clear. The patient is status post bilateral lens replacements. The globes and orbits are otherwise intact. CT CERVICAL SPINE FINDINGS Alignment: AP alignment is anatomic. Exaggerated cervical lordosis is present. Skull base and vertebrae: The skullbase is normal. The craniocervical junction is unremarkable. Vertebral body heights are maintained. No acute fracture or traumatic subluxation is present. Soft tissues and spinal canal: A dominant left thyroid nodule measures 1.2 cm. Atherosclerotic calcifications are present at the carotid bifurcations bilaterally. No significant adenopathy is present. Disc levels: Uncovertebral spurring an osseous foraminal narrowing is present bilaterally at C5-6 and C6-7. More mild uncovertebral spurring is present at C4-5. Upper chest: Biapical scarring is present. There is some bronchiectasis. The superior mediastinum is unremarkable. IMPRESSION: 1. Large intraparenchymal hemorrhage on the right measures 5.7 x 4.5 x 8.1 cm. Estimated volume is 110 cm^3. 2. Significant mass effect with effacement of the sulci and right lateral ventricle and 14 mm right-to-left midline shift. 3. Intraventricular extension of hemorrhage. 4. Generalized atrophy and white matter disease bilaterally is otherwise stable. 5. Multilevel degenerative changes in the cervical spine with exaggerated cervical lordosis. These results were called by telephone at the time of interpretation on 12/05/2016 at 2:51 pm to Dr. Ritta SlotMCNEILL KIRKPATRICK , who verbally acknowledged these results. Electronically Signed   By: Marin Robertshristopher  Mattern M.D.   On: 12/05/2016 14:57     Scheduled Meds: . sodium chloride flush  3 mL Intravenous Q12H   Continuous Infusions:   LOS: 1 day   Time Spent in minutes   30 minutes  Aireanna Luellen D.O. on 12/06/2016 at 1:01 PM  Between 7am  to 7pm - Pager - [phone]  After 7pm go to [email or web address] - password [email or web address]  And look for the night coverage person covering for me after hours  [facility] Office  [phone]

## 2016-12-07 DIAGNOSIS — E039 Hypothyroidism, unspecified: Secondary | ICD-10-CM

## 2016-12-07 DIAGNOSIS — Z515 Encounter for palliative care: Secondary | ICD-10-CM

## 2016-12-07 MED ORDER — WHITE PETROLATUM GEL
Status: AC
  Administered 2016-12-07: 09:00:00
  Filled 2016-12-07: qty 1

## 2016-12-07 NOTE — Plan of Care (Signed)
Problem: Nutrition: Goal: Adequate nutrition will be maintained Outcome: Not Applicable Date Met: 95/63/87 eol

## 2016-12-07 NOTE — Consult Note (Signed)
Consultation Note Date: 12/07/2016   Patient Name: Deanna Ortiz  DOB: Feb 14, 1925  MRN: [id number]  Age / Sex: 82 y.o., female  PCP: Geoffry Paradiseichard Aronson, MD Referring Physician: Edsel PetrinMaryann Mikhail, DO  Reason for Consultation: Establishing goals of care  HPI/Patient Profile: 82 y.o. female  with past medical history of hypo thyroidism admitted on 12/05/2016 with brain bleed.   Clinical Assessment and Goals of Care:  Deanna Ortiz a 82 y.o.femalewith past medical history of hypothyroidism presented to the emergency room with stroke. Patient was living independently prior to this hospitalization, she was working in her garden early in the day on 12-05-16 and fell over. She tried to get up but could not. She was outside for an unknown amount time possiblyseveral hours before she was found by family member. She was brought in to the ED, CT brain with Intraparenchymal hemorrhage on the right measuring 5.7 x 4.5 x 8.1 cm, significant mass effect with effacement Patient initially called in as a code stroke and was going to be admitted to ICU however patient was transitioned to comfort care. Palliative consult placed for further discussions with family.   The patient is resting in bed, she is in no distress. Discussed with Dr Catha GosselinMikhail, then discussed with son daughter and daughter in law outside the room. I introduced myself and palliative care as follows: Palliative medicine is specialized medical care for people living with serious illness. It focuses on providing relief from the symptoms and stress of a serious illness. The goal is to improve quality of life for both the patient and the family.  Patient's children are still in shock about the patient's sudden rapid decline. They understand she has a serious life limiting condition, they are accepting of their decision to have not pursued aggressive measures, patient had  been clear about her advanced directives, she did not want her life prolonged by artificial means. At times, she is having pain in her joints, she is otherwise in no distress.   I introduced hospice as an extra layer of support, we discussed about comfort measures at end of life, see below, thank you for the consult.    Code Status: DNR    NEXT OF KIN  son and daughter   SUMMARY OF RECOMMENDATIONS    Agree with DNR DNI comfort measures only Discussed with son and daughter about hospice support, discussed in detail about home with hospice versus residential hospice support. All questions answered. Family might tour [facility] today, I will follow up with them tomorrow, regarding their decision for discharge. They had hospice care for the patient's husband who died of cancer years ago.   Code Status/Advance Care Planning:  DNR    Symptom Management:     Continue current medications. No acute uncontrolled symptoms currently.  Palliative Prophylaxis:   Bowel Regimen  Additional Recommendations (Limitations, Scope, Preferences):  Full Comfort Care  Psycho-social/Spiritual:   Desire for further Chaplaincy support:yes  Additional Recommendations: Education on Hospice  Prognosis:   < 2 weeks  Discharge  Planning: Home with Hospice versus residential hospice.       Primary Diagnoses: Present on Admission: . ICH (intracerebral hemorrhage) (HCC) . Intracranial hematoma (HCC)   I have reviewed the medical record, interviewed the patient and family, and examined the patient. The following aspects are pertinent.  No past medical history on file. Social History   Social History  . Marital status: Married    Spouse name: N/A  . Number of children: N/A  . Years of education: N/A   Social History Main Topics  . Smoking status: Not on file  . Smokeless tobacco: Not on file  . Alcohol use Not on file  . Drug use: Unknown  . Sexual activity: Not on file   Other  Topics Concern  . Not on file   Social History Narrative  . No narrative on file   No family history on file. Scheduled Meds: . sodium chloride flush  3 mL Intravenous Q12H  . white petrolatum       Continuous Infusions: PRN Meds:.sodium chloride, acetaminophen **OR** acetaminophen, albuterol, chlorproMAZINE (THORAZINE) IV, diphenhydrAMINE, fentaNYL (SUBLIMAZE) injection, haloperidol **OR** haloperidol **OR** haloperidol lactate, LORazepam **OR** LORazepam **OR** LORazepam, LORazepam, magic mouthwash, ondansetron (ZOFRAN) IV, polyvinyl alcohol, senna, sodium chloride flush, traZODone Medications Prior to Admission:  Prior to Admission medications   Medication Sig Start Date End Date Taking? Authorizing Provider  acetaminophen (TYLENOL) 500 MG tablet Take 500 mg by mouth daily as needed for pain.   Yes Historical Provider, MD  ibuprofen (ADVIL,MOTRIN) 200 MG tablet Take 400 mg by mouth every 6 (six) hours as needed for pain.   Yes Historical Provider, MD  levothyroxine (SYNTHROID, LEVOTHROID) 50 MCG tablet Take 50 mcg by mouth every evening. 01/02/16  Yes Historical Provider, MD  Calcium Carbonate-Vitamin D (OSCAL 500/200 D-3 PO) Take 1 tablet by mouth daily as needed (takes occasionally).     Historical Provider, MD   Allergies  Allergen Reactions  . Codeine Nausea And Vomiting  . Demerol [Meperidine] Nausea And Vomiting and Other (See Comments)    headache   Review of Systems Denies complaints.    Physical Exam Thin frail lady Clear S1 S2 Abdomen soft No edema Generalized weakness  Vital Signs: BP (!) 166/61 (BP Location: Left Arm)   Pulse 86   Temp 98.1 F (36.7 C) (Axillary)   Resp 15   SpO2 94%  Pain Assessment: PAINAD   Pain Score: Asleep   SpO2: SpO2: 94 % O2 Device:SpO2: 94 % O2 Flow Rate: .   IO: Intake/output summary:  Intake/Output Summary (Last 24 hours) at 12/07/16 1157 Last data filed at 12/07/16 [id number]  Gross per 24 hour  Intake                0 ml    Output             1375 ml  Net            -1375 ml    LBM: Last BM Date: 12/04/16 Baseline Weight:   Most recent weight:       Palliative Assessment/Data:     Time In:  10 am  Time Out: 11.10 Time Total: 70 min  Greater than 50%  of this time was spent counseling and coordinating care related to the above assessment and plan.  Signed by: Rosalin HawkingZeba Yuliza Cara, MD  [phone]  Please contact Palliative Medicine Team phone at [phone] for questions and concerns.  For individual provider: See Loretha StaplerAmion

## 2016-12-07 NOTE — Progress Notes (Signed)
SLP Cancellation Note  Patient Details Name: Robet Leuancy M Meaney MRN: [id number] DOB: Dec 26, 1924   Cancelled treatment:       Reason Eval/Treat Not Completed: Other (comment) (Pt is transitioning to hospice with comfort approach- SLP services will sign off )   Blenda MountsCouture, Jazzmin Newbold Laurice 12/07/2016, 12:41 PM

## 2016-12-07 NOTE — Progress Notes (Signed)
PROGRESS NOTE    Deanna Ortiz  EAV:[id number] DOB: 09-01-1925 DOA: 12/05/2016 PCP: Minda MeoARONSON,RICHARD A, MD   No chief complaint on file.    Brief Narrative:  HPI on 12/05/2016 by Dr. Eliezer BottomPhillip Ortiz Deanna Deanna Ortiz is a 82 y.o. female  with past medical history of hypothyroidism presented to the emergency room with stroke. Patient was in her garden early in the day and fell over. She tried to get up but could not. She was outside for an unknown amount time possibly several hours before she was found by family member. Assessment & Plan   Intraparenchymal hemorrhage -CT head showed large intraparenchymal hemorrhage on the right measuring 5.7 x 4.5 x 8.1 cm, significant mass effect with effacement -Patient initially called in as a code stroke and was going to be admitted to ICU however patient was transitioned to comfort care. -Neurology consulted and appreciated -Palliative care consulted and appreciated -Continue pain control, anti-anxiolytics as needed -Discussed possible home with hospice versus residential hospice with patient's family members  Hypothyroidism -Medications on hold as patient has been transitioned to comfort care.  DVT Prophylaxis  None  Code Status: DNR  Family Communication: Family at bedside  Disposition Plan: Admitted. Pending decision regarding home with hospice vs [facility].  Consultants Neurology Palliative care  Procedures  None  Antibiotics   Anti-infectives    None      Subjective:   Bonney LeitzNancy Ortiz seen and examined today.  Currently sleeping, does not follow commands.    Objective:   Vitals:   12/06/16 0000 12/06/16 1513 12/06/16 2201 12/07/16 0614  BP: 134/67 (!) 152/59 (!) 173/60 (!) 166/61  Pulse: 67 90 75 86  Resp: 18  15 15   Temp: 98.4 F (36.9 C) 97.5 F (36.4 C)  98.1 F (36.7 C)  TempSrc: Oral Oral  Axillary  SpO2: 97% 96% 95% 94%    Intake/Output Summary (Last 24 hours) at 12/07/16 1237 Last data filed at 12/07/16 [id number]  Gross per 24 hour  Intake                0 ml  Output             1375 ml  Net            -1375 ml   There were no vitals filed for this visit.  Exam  General: Well developed, thin, no distress  HEENT: NCAT, mucous membranes moist.   Cardiovascular: S1 S2 auscultated, no rubs, murmurs or gallops. Regular rate and rhythm.  Respiratory: Clear to auscultation bilaterally with equal chest rise  Abdomen: Soft, nontender, nondistended, + bowel sounds  Extremities: warm dry without cyanosis clubbing or edema  Neuro: unable to fully assess  Data Reviewed: I have personally reviewed following labs and imaging studies  CBC:  Recent Labs Lab 12/05/16 1427 12/05/16 1430  WBC 10.6*  --   NEUTROABS 9.3*  --   HGB 12.1 11.9*  HCT 36.8 35.0*  MCV 90.0  --   PLT 256  --    Basic Metabolic Panel:  Recent Labs Lab 12/05/16 1427 12/05/16 1430  NA 134* 136  K 3.8 3.7  CL 98* 97*  CO2 25  --   GLUCOSE 146* 144*  BUN 11 14  CREATININE 0.77 0.70  CALCIUM 9.7  --    GFR: CrCl cannot be calculated (Unknown ideal weight.). Liver Function Tests:  Recent Labs Lab 12/05/16 1427  AST 28  ALT 14  ALKPHOS 51  BILITOT 0.7  PROT 7.0  ALBUMIN 4.1   No results for input(s): LIPASE, AMYLASE in the last 168 hours. No results for input(s): AMMONIA in the last 168 hours. Coagulation Profile:  Recent Labs Lab 12/05/16 1427  INR 0.99   Cardiac Enzymes: No results for input(s): CKTOTAL, CKMB, CKMBINDEX, TROPONINI in the last 168 hours. BNP (last 3 results) No results for input(s): PROBNP in the last 8760 hours. HbA1C: No results for input(s): HGBA1C in the last 72 hours. CBG:  Recent Labs Lab 12/05/16 1428  GLUCAP 137*   Lipid Profile: No results for input(s): CHOL, HDL, LDLCALC, TRIG, CHOLHDL, LDLDIRECT in the last 72 hours. Thyroid Function Tests: No results for input(s): TSH, T4TOTAL, FREET4, T3FREE, THYROIDAB in the last 72 hours. Anemia Panel: No results for  input(s): VITAMINB12, FOLATE, FERRITIN, TIBC, IRON, RETICCTPCT in the last 72 hours. Urine analysis: No results found for: COLORURINE, APPEARANCEUR, LABSPEC, PHURINE, GLUCOSEU, HGBUR, BILIRUBINUR, KETONESUR, PROTEINUR, UROBILINOGEN, NITRITE, LEUKOCYTESUR Sepsis Labs: @LABRCNTIP (procalcitonin:4,lacticidven:4)  )No results found for this or any previous visit (from the past 240 hour(s)).    Radiology Studies: Ct Head Wo Contrast  Result Date: 12/05/2016 CLINICAL DATA:  Found unresponsive. Code stroke. Last seen normal at 9 a.m. EXAM: CT HEAD WITHOUT CONTRAST CT CERVICAL SPINE WITHOUT CONTRAST TECHNIQUE: Multidetector CT imaging of the head and cervical spine was performed following the standard protocol without intravenous contrast. Multiplanar CT image reconstructions of the cervical spine were also generated. COMPARISON:  None. FINDINGS: CT HEAD FINDINGS Brain: A large right hemispheric hemorrhage measures 5.7 x 4.5 x 8.1 cm (volume = 110 cm^3). There is significant mass effect with effacement of the sulci midline shift measures least 14 mm. There is effacement of the right lateral ventricle. Intraventricular hemorrhage is seen bilaterally. Vascular: Atherosclerotic calcifications are present within the cavernous internal carotid arteries bilaterally. There is no hyperdense vessel. Skull: Calvarium is intact. No focal lytic or blastic lesions are present. Sinuses/Orbits: The paranasal sinuses and mastoid air cells are clear. The patient is status post bilateral lens replacements. The globes and orbits are otherwise intact. CT CERVICAL SPINE FINDINGS Alignment: AP alignment is anatomic. Exaggerated cervical lordosis is present. Skull base and vertebrae: The skullbase is normal. The craniocervical junction is unremarkable. Vertebral body heights are maintained. No acute fracture or traumatic subluxation is present. Soft tissues and spinal canal: A dominant left thyroid nodule measures 1.2 cm.  Atherosclerotic calcifications are present at the carotid bifurcations bilaterally. No significant adenopathy is present. Disc levels: Uncovertebral spurring an osseous foraminal narrowing is present bilaterally at C5-6 and C6-7. More mild uncovertebral spurring is present at C4-5. Upper chest: Biapical scarring is present. There is some bronchiectasis. The superior mediastinum is unremarkable. IMPRESSION: 1. Large intraparenchymal hemorrhage on the right measures 5.7 x 4.5 x 8.1 cm. Estimated volume is 110 cm^3. 2. Significant mass effect with effacement of the sulci and right lateral ventricle and 14 mm right-to-left midline shift. 3. Intraventricular extension of hemorrhage. 4. Generalized atrophy and white matter disease bilaterally is otherwise stable. 5. Multilevel degenerative changes in the cervical spine with exaggerated cervical lordosis. These results were called by telephone at the time of interpretation on 12/05/2016 at 2:51 pm to Dr. Ritta SlotMCNEILL KIRKPATRICK , who verbally acknowledged these results. Electronically Signed   By: Marin Robertshristopher  Mattern M.D.   On: 12/05/2016 14:57   Ct Cervical Spine Wo Contrast  Result Date: 12/05/2016 CLINICAL DATA:  Found unresponsive. Code stroke. Last seen normal at 9 a.m. EXAM: CT HEAD WITHOUT CONTRAST CT CERVICAL SPINE WITHOUT  CONTRAST TECHNIQUE: Multidetector CT imaging of the head and cervical spine was performed following the standard protocol without intravenous contrast. Multiplanar CT image reconstructions of the cervical spine were also generated. COMPARISON:  None. FINDINGS: CT HEAD FINDINGS Brain: A large right hemispheric hemorrhage measures 5.7 x 4.5 x 8.1 cm (volume = 110 cm^3). There is significant mass effect with effacement of the sulci midline shift measures least 14 mm. There is effacement of the right lateral ventricle. Intraventricular hemorrhage is seen bilaterally. Vascular: Atherosclerotic calcifications are present within the cavernous internal  carotid arteries bilaterally. There is no hyperdense vessel. Skull: Calvarium is intact. No focal lytic or blastic lesions are present. Sinuses/Orbits: The paranasal sinuses and mastoid air cells are clear. The patient is status post bilateral lens replacements. The globes and orbits are otherwise intact. CT CERVICAL SPINE FINDINGS Alignment: AP alignment is anatomic. Exaggerated cervical lordosis is present. Skull base and vertebrae: The skullbase is normal. The craniocervical junction is unremarkable. Vertebral body heights are maintained. No acute fracture or traumatic subluxation is present. Soft tissues and spinal canal: A dominant left thyroid nodule measures 1.2 cm. Atherosclerotic calcifications are present at the carotid bifurcations bilaterally. No significant adenopathy is present. Disc levels: Uncovertebral spurring an osseous foraminal narrowing is present bilaterally at C5-6 and C6-7. More mild uncovertebral spurring is present at C4-5. Upper chest: Biapical scarring is present. There is some bronchiectasis. The superior mediastinum is unremarkable. IMPRESSION: 1. Large intraparenchymal hemorrhage on the right measures 5.7 x 4.5 x 8.1 cm. Estimated volume is 110 cm^3. 2. Significant mass effect with effacement of the sulci and right lateral ventricle and 14 mm right-to-left midline shift. 3. Intraventricular extension of hemorrhage. 4. Generalized atrophy and white matter disease bilaterally is otherwise stable. 5. Multilevel degenerative changes in the cervical spine with exaggerated cervical lordosis. These results were called by telephone at the time of interpretation on 12/05/2016 at 2:51 pm to Dr. Ritta Slot , who verbally acknowledged these results. Electronically Signed   By: Marin Roberts M.D.   On: 12/05/2016 14:57     Scheduled Meds: . sodium chloride flush  3 mL Intravenous Q12H  . white petrolatum       Continuous Infusions:   LOS: 2 days   Time Spent in minutes    30 minutes  Manar Smalling D.O. on 12/07/2016 at 12:37 PM  Between 7am to 7pm - Pager - [phone]  After 7pm go to [email or web address] - password [email or web address]  And look for the night coverage person covering for me after hours  [facility] Office  [phone]

## 2016-12-08 MED ORDER — HYDRALAZINE HCL 20 MG/ML IJ SOLN
10.0000 mg | Freq: Four times a day (QID) | INTRAMUSCULAR | Status: DC | PRN
  Administered 2016-12-08: 10 mg via INTRAVENOUS
  Filled 2016-12-08: qty 1

## 2016-12-08 NOTE — Progress Notes (Signed)
Wasted 87.5 mg of Fentanyl down sink with Kenyon Anaakita, Charity fundraiserN.

## 2016-12-08 NOTE — Progress Notes (Signed)
PROGRESS NOTE    Deanna Ortiz  ZOX:[id number] DOB: 1925-03-24 DOA: 12/05/2016 PCP: Minda MeoARONSON,RICHARD A, MD   No chief complaint on file.    Brief Narrative:  HPI on 12/05/2016 by Dr. Eliezer BottomPhillip Hobbs Deanna Ortiz is a 82 y.o. female  with past medical history of hypothyroidism presented to the emergency room with stroke. Patient was in her garden early in the day and fell over. She tried to get up but could not. She was outside for an unknown amount time possibly several hours before she was found by family member. Assessment & Plan   Intraparenchymal hemorrhage -CT head showed large intraparenchymal hemorrhage on the right measuring 5.7 x 4.5 x 8.1 cm, significant mass effect with effacement -Patient initially called in as a code stroke and was going to be admitted to ICU however patient was transitioned to comfort care. -Neurology consulted and appreciated -Palliative care consulted and appreciated -Continue pain control, anti-anxiolytics as needed -Discussed possible home with hospice versus residential hospice with patient's family members. Family has opted for [facility].  -Social work consulted for placement  Hypothyroidism -Medications on hold as patient has been transitioned to comfort care.  DVT Prophylaxis  None  Code Status: DNR  Family Communication: Family at bedside  Disposition Plan: Admitted. Pending [facility]- [facility].  Consultants Neurology Palliative care  Procedures  None  Antibiotics   Anti-infectives    None      Subjective:   Deanna LeitzNancy Ortiz seen and examined today.  Currently sleeping. Answers simple questions.  Objective:   Vitals:   12/07/16 1259 12/07/16 2109 12/08/16 0121 12/08/16 0500  BP: (!) 166/59 (!) 167/74 (!) 182/61 (!) 189/62  Pulse: 63 72 86 (!) 103  Resp: 16 19 19 19   Temp: 99 F (37.2 C) 97.7 F (36.5 C) 98 F (36.7 C) 98.6 F (37 C)  TempSrc: Axillary Oral Oral Oral  SpO2: 92% 93% 95% 95%    Intake/Output Summary  (Last 24 hours) at 12/08/16 1007 Last data filed at 12/08/16 0500  Gross per 24 hour  Intake               30 ml  Output              685 ml  Net             -655 ml   There were no vitals filed for this visit.  Exam  General: Well developed, thin, no distress  HEENT: NCAT, mucous membranes moist.   Cardiovascular: S1 S2 auscultated, RRR  Respiratory: Clear to auscultation bilaterally with equal chest rise  Extremities: warm dry without cyanosis clubbing or edema  Data Reviewed: I have personally reviewed following labs and imaging studies  CBC:  Recent Labs Lab 12/05/16 1427 12/05/16 1430  WBC 10.6*  --   NEUTROABS 9.3*  --   HGB 12.1 11.9*  HCT 36.8 35.0*  MCV 90.0  --   PLT 256  --    Basic Metabolic Panel:  Recent Labs Lab 12/05/16 1427 12/05/16 1430  NA 134* 136  K 3.8 3.7  CL 98* 97*  CO2 25  --   GLUCOSE 146* 144*  BUN 11 14  CREATININE 0.77 0.70  CALCIUM 9.7  --    GFR: CrCl cannot be calculated (Unknown ideal weight.). Liver Function Tests:  Recent Labs Lab 12/05/16 1427  AST 28  ALT 14  ALKPHOS 51  BILITOT 0.7  PROT 7.0  ALBUMIN 4.1   No results for  input(s): LIPASE, AMYLASE in the last 168 hours. No results for input(s): AMMONIA in the last 168 hours. Coagulation Profile:  Recent Labs Lab 12/05/16 1427  INR 0.99   Cardiac Enzymes: No results for input(s): CKTOTAL, CKMB, CKMBINDEX, TROPONINI in the last 168 hours. BNP (last 3 results) No results for input(s): PROBNP in the last 8760 hours. HbA1C: No results for input(s): HGBA1C in the last 72 hours. CBG:  Recent Labs Lab 12/05/16 1428  GLUCAP 137*   Lipid Profile: No results for input(s): CHOL, HDL, LDLCALC, TRIG, CHOLHDL, LDLDIRECT in the last 72 hours. Thyroid Function Tests: No results for input(s): TSH, T4TOTAL, FREET4, T3FREE, THYROIDAB in the last 72 hours. Anemia Panel: No results for input(s): VITAMINB12, FOLATE, FERRITIN, TIBC, IRON, RETICCTPCT in the last  72 hours. Urine analysis: No results found for: COLORURINE, APPEARANCEUR, LABSPEC, PHURINE, GLUCOSEU, HGBUR, BILIRUBINUR, KETONESUR, PROTEINUR, UROBILINOGEN, NITRITE, LEUKOCYTESUR Sepsis Labs: @LABRCNTIP (procalcitonin:4,lacticidven:4)  )No results found for this or any previous visit (from the past 240 hour(s)).    Radiology Studies: No results found.   Scheduled Meds: . sodium chloride flush  3 mL Intravenous Q12H   Continuous Infusions:   LOS: 3 days   Time Spent in minutes   30 minutes  Shirin Echeverry D.O. on 12/08/2016 at 10:07 AM  Between 7am to 7pm - Pager - [phone]  After 7pm go to [email or web address] - password [email or web address]  And look for the night coverage person covering for me after hours  [facility] Office  [phone]

## 2016-12-08 NOTE — Progress Notes (Addendum)
STROKE TEAM PROGRESS NOTE   HISTORY OF PRESENT ILLNESS (per record) Deanna Ortiz is a 82 y.o. female with no significant past medical history who presents with acute onset left hemiparesis. She was seen well at 9am, then found down by a neighbor. She was brought in as a code stroke and found to have a large(110 cc) hemorrhage on head CT. She was hypertensive to the 180s, but after labetalol and low dose cardene, come down quickly to the 110s - 120s.   Initially planned for ICU admission, but after extensive discussion with family, she was made full comfort care.   She was LKW: 9 am the day of admission. tPA was not given due to ICH.    SUBJECTIVE (INTERVAL HISTORY) Her family is at the bedside - son, grandson and daughter-in-law. Family report she is comfortable. Using pain meds prn HA. Patient alert with memory of hemorrhage and subsequent events. Does have significant neglect.    OBJECTIVE Temp:  [97.7 F (36.5 C)-99 F (37.2 C)] 98.1 F (36.7 C) (12/25 1011) Pulse Rate:  [63-103] 68 (12/25 1011) Resp:  [16-19] 19 (12/25 0500) BP: (164-189)/(59-74) 164/69 (12/25 1011) SpO2:  [92 %-95 %] 95 % (12/25 1011)  CBC:   Recent Labs Lab 12/05/16 1427 12/05/16 1430  WBC 10.6*  --   NEUTROABS 9.3*  --   HGB 12.1 11.9*  HCT 36.8 35.0*  MCV 90.0  --   PLT 256  --     Basic Metabolic Panel:   Recent Labs Lab 12/05/16 1427 12/05/16 1430  NA 134* 136  K 3.8 3.7  CL 98* 97*  CO2 25  --   GLUCOSE 146* 144*  BUN 11 14  CREATININE 0.77 0.70  CALCIUM 9.7  --     Lipid Panel: No results found for: CHOL, TRIG, HDL, CHOLHDL, VLDL, LDLCALC HgbA1c: No results found for: HGBA1C Urine Drug Screen: No results found for: LABOPIA, COCAINSCRNUR, LABBENZ, AMPHETMU, THCU, LABBARB    IMAGING  Ct Head Wo Contrast 12/05/2016 1. Large intraparenchymal hemorrhage on the right measures 5.7 x 4.5 x 8.1 cm. Estimated volume is 110 cm^3.  2. Significant mass effect with effacement of the  sulci and right lateral ventricle and 14 mm right-to-left midline shift.  3. Intraventricular extension of hemorrhage.  4. Generalized atrophy and white matter disease bilaterally is otherwise stable.   Ct Cervical Spine Wo Contrast 12/05/2016 Multilevel degenerative changes in the cervical spine with exaggerated cervical lordosis.    PHYSICAL EXAM Constitutional: Appears elderly.  Psych: Affect appropriate to situation Eyes: No scleral injection HENT: No OP obstrucion Head: Normocephalic.  Cardiovascular: Normal rate and regular rhythm.  Respiratory: Effort normal  GI: Soft.  No distension. There is no tenderness.  Skin: WDI  Neuro: Mental Status: Patient is lethargic, sedated but can be aroused. Onset was sent names and follow simple midline and one-step commands. Has right gaze preference and left gaze palsy. Does not blink to threat on the left. Has left hemi-neglect and would not recognize her own  left hand. Dense left hemiplegia. Purposeful right sided movements. ASSESSMENT/PLAN Deanna Ortiz is a 82 y.o. female with history of hypothyroidism presenting with left hemiparesis. She did not receive IV t-PA due to ICH.  Large intraparenchymal hemorrhage:  Non-dominant - secondary to hypertension.  Resultant: prognosis is poor, large right intraparenchymal hemorrhage with intraventricular extension.  MRI - not performed.  MRA - not performed.  Carotid Doppler - not indicated.  2D Echo - not indicated.  LDL - not indicated.  HgbA1c - not indicated.  VTE prophylaxis - comfort measures only. Diet Heart Room service appropriate? Yes; Fluid consistency: Thinm- comfort feeds  No antithrombotic prior to admission, now on No antithrombotic secondary to intraparenchymal hemorrhage.  Continue full comfort care  Agreeable with plans for [facility] or other non-hospital Hospice care.  Nothing further to add from stroke standpoint  Stroke team will sign off. Please,  call for questions, concerns.  Other Stroke Risk Factors  Advanced age  Hypertension .  Hospital day # 3  Rhoderick MoodyBIBY,SHARON  [facility] See Amion for Pager information 12/08/2016 10:56 AM  I have personally examined this patient, reviewed notes, independently viewed imaging studies, participated in medical decision making and plan of care.ROS completed by me personally and pertinent positives fully documented  I have made any additions or clarifications directly to the above note. Agree with note above.  I had a long discussion at the bedside with the patient and her son and daughter-in-law and answered questions about her brain hemorrhage, prognosis and comfort care. Recommend [facility] to [facility] for ongoing palliative care needs over the next 1-2 days. Greater than 50% time during this 25 minute visit was spent on counseling and coordination of care about her brain hemorrhage and comfort care issues. Stroke team will sign off. Kindly call for questions.  Delia HeadyPramod Ling Flesch, MD Medical Director [facility] Pager: [phone] 12/08/2016 1:45 PM  To contact Stroke Continuity provider, please refer to [email or web address]. After hours, contact General Neurology

## 2016-12-09 MED ORDER — POLYETHYLENE GLYCOL 3350 17 G PO PACK
17.0000 g | PACK | Freq: Every day | ORAL | Status: DC
  Administered 2016-12-09: 17 g via ORAL
  Filled 2016-12-09: qty 1

## 2016-12-09 NOTE — Care Management Important Message (Signed)
Important Message  Patient Details  Name: Deanna Ortiz MRN: [id number] Date of Birth: 04-18-1925   Medicare Important Message Given:  Yes    Dorena BodoIris Freada [place] 12/09/2016, 2:20 PM

## 2016-12-09 NOTE — Clinical Social Work Placement (Signed)
   CLINICAL SOCIAL WORK PLACEMENT  NOTE  Date:  12/09/2016  Patient Details  Name: Deanna Ortiz MRN: [id number] Date of Birth: [birth year]  Clinical Social Work is seeking post-discharge placement for this patient at the  [facility]) level of care (*CSW will initial, date and re-position this form in  chart as items are completed):  Yes   Patient/family provided with [facility] Clinical Social Work Department's list of facilities offering this level of care within the geographic area requested by the patient (or if unable, by the patient's family).  Yes   Patient/family informed of their freedom to choose among providers that offer the needed level of care, that participate in Medicare, Medicaid or managed care program needed by the patient, have an available bed and are willing to accept the patient.  Yes   Patient/family informed of [facility]'s ownership interest in [facility] and [facility], as well as of the fact that they are under no obligation to receive care at these facilities.  PASRR submitted to EDS on       PASRR number received on       Existing PASRR number confirmed on       FL2 transmitted to all facilities in geographic area requested by pt/family on       FL2 transmitted to all facilities within larger geographic area on       Patient informed that his/her managed care company has contracts with or will negotiate with certain facilities, including the following:            Patient/family informed of bed offers received.  Patient [facility] bed at  [facility])     Physician recommends and patient chooses bed at      Patient to be transferred to  [facility]) on  .  Patient to be transferred to facility by  [facility])     Patient family notified on 12/09/16 of transfer.  Name of family member notified:  Misty StanleyLisa [phone]     PHYSICIAN       Additional Comment:    _______________________________________________ Norlene DuelBROWN, Liyah Higham B,  LCSWA 12/09/2016, 2:39 PM

## 2016-12-09 NOTE — Clinical Social Work Note (Addendum)
CSW was updated by [facility] that pt/family agreeable with hospice, [facility]. CSW met with family @ bedside; Lattie Haw (dtr) [phone] and son Darnell Level) [phone]. Both are agreeable with hospice, [facility] is the preference. If no [employer], the family will take pt home. The family understands that they have many choices but are set on this facility only.  CSW notified [facility] liaison that is on site today Abigail Butts [phone], Abigail Butts is awaiting to see if pt meets criteria. [facility] have beds available today. CSW will continue to monitor and remain available for pt/family as needs are identified.  [facility] confirmed pt meets criteria, met with family, all consents signed. CSW set up transport with [facility], informed family, updated nurse. All forms placed on pts chart for transport. Pt has no other needs at this time. CSW is signing off.  Shondrea Steinert B. [facility] Clinical Social Work Dept Weekend Social Worker [phone] 11:05 AM

## 2016-12-09 NOTE — Progress Notes (Signed)
Report called to [facility], EMS taking patient there now, family present and are aware of the transfer. DNR paper filled and signed by physician and given to EMS. IV taken out of left hand, foley in place and remaining in.

## 2016-12-09 NOTE — Discharge Summary (Signed)
Physician Discharge Summary  Deanna Ortiz ZOX:[id number] DOB: 08-Dec-1925 DOA: 12/05/2016  PCP: Minda Meo, MD  Admit date: 12/05/2016 Discharge date: 12/09/2016  Time spent: 45 minutes  Recommendations for Outpatient Follow-up:  Patient will be discharged to [facility].    Discharge Diagnoses:  Intraparenchymal hemorrhage Hypothyroidism  Discharge Condition: Stable  Diet recommendation: Comfort  There were no vitals filed for this visit.  History of present illness:  on 12/05/2016 by Dr. Penelope Coop a 82 y.o.femalewith past medical history of hypothyroidism presented to the emergency room with stroke. Patient was in her garden early in the day and fell over. She tried to get up but could not. She was outside for an unknown amount time possiblyseveral hours before she was found by family member.  Hospital Course:  Intraparenchymal hemorrhage -CT head showed large intraparenchymal hemorrhage on the right measuring 5.7 x 4.5 x 8.1 cm, significant mass effect with effacement -Patient initially called in as a code stroke and was going to be admitted to ICU however patient was transitioned to comfort care. -Neurology consulted and appreciated -Palliative care consulted and appreciated -Continue pain control, anti-anxiolytics as needed -Discussed possible home with hospice versus residential hospice with patient's family members. Family has opted for [facility].  -Social work consulted for placement  Hypothyroidism -Medications on hold as patient has been transitioned to comfort care.  Code status: DNR  Consultants Neurology Palliative care  Procedures  None  Discharge Exam: Vitals:   12/08/16 2029 12/09/16 0607  BP: (!) 157/97 (!) 157/65  Pulse: 87 91  Resp: 18 18  Temp: 98.6 F (37 C) 98.7 F (37.1 C)   Exam  General: Well developed, thin, no distress  HEENT: NCAT, mucous membranes moist.   Cardiovascular: S1 S2  auscultated, RRR  Respiratory: Clear to auscultation bilaterally with equal chest rise  Extremities: warm dry without cyanosis clubbing or edema  Discharge Instructions  Current Discharge Medication List    CONTINUE these medications which have NOT CHANGED   Details  acetaminophen (TYLENOL) 500 MG tablet Take 500 mg by mouth daily as needed for pain.      STOP taking these medications     ibuprofen (ADVIL,MOTRIN) 200 MG tablet      levothyroxine (SYNTHROID, LEVOTHROID) 50 MCG tablet      Calcium Carbonate-Vitamin D (OSCAL 500/200 D-3 PO)        Allergies  Allergen Reactions  . Codeine Nausea And Vomiting  . Demerol [Meperidine] Nausea And Vomiting and Other (See Comments)    headache      The results of significant diagnostics from this hospitalization (including imaging, microbiology, ancillary and laboratory) are listed below for reference.    Significant Diagnostic Studies: Ct Head Wo Contrast  Result Date: 12/05/2016 CLINICAL DATA:  Found unresponsive. Code stroke. Last seen normal at 9 a.m. EXAM: CT HEAD WITHOUT CONTRAST CT CERVICAL SPINE WITHOUT CONTRAST TECHNIQUE: Multidetector CT imaging of the head and cervical spine was performed following the standard protocol without intravenous contrast. Multiplanar CT image reconstructions of the cervical spine were also generated. COMPARISON:  None. FINDINGS: CT HEAD FINDINGS Brain: A large right hemispheric hemorrhage measures 5.7 x 4.5 x 8.1 cm (volume = 110 cm^3). There is significant mass effect with effacement of the sulci midline shift measures least 14 mm. There is effacement of the right lateral ventricle. Intraventricular hemorrhage is seen bilaterally. Vascular: Atherosclerotic calcifications are present within the cavernous internal carotid arteries bilaterally. There is no hyperdense vessel. Skull: Calvarium  is intact. No focal lytic or blastic lesions are present. Sinuses/Orbits: The paranasal sinuses and mastoid  air cells are clear. The patient is status post bilateral lens replacements. The globes and orbits are otherwise intact. CT CERVICAL SPINE FINDINGS Alignment: AP alignment is anatomic. Exaggerated cervical lordosis is present. Skull base and vertebrae: The skullbase is normal. The craniocervical junction is unremarkable. Vertebral body heights are maintained. No acute fracture or traumatic subluxation is present. Soft tissues and spinal canal: A dominant left thyroid nodule measures 1.2 cm. Atherosclerotic calcifications are present at the carotid bifurcations bilaterally. No significant adenopathy is present. Disc levels: Uncovertebral spurring an osseous foraminal narrowing is present bilaterally at C5-6 and C6-7. More mild uncovertebral spurring is present at C4-5. Upper chest: Biapical scarring is present. There is some bronchiectasis. The superior mediastinum is unremarkable. IMPRESSION: 1. Large intraparenchymal hemorrhage on the right measures 5.7 x 4.5 x 8.1 cm. Estimated volume is 110 cm^3. 2. Significant mass effect with effacement of the sulci and right lateral ventricle and 14 mm right-to-left midline shift. 3. Intraventricular extension of hemorrhage. 4. Generalized atrophy and white matter disease bilaterally is otherwise stable. 5. Multilevel degenerative changes in the cervical spine with exaggerated cervical lordosis. These results were called by telephone at the time of interpretation on 12/05/2016 at 2:51 pm to Dr. Ritta SlotMCNEILL KIRKPATRICK , who verbally acknowledged these results. Electronically Signed   By: Marin Robertshristopher  Mattern M.D.   On: 12/05/2016 14:57   Ct Cervical Spine Wo Contrast  Result Date: 12/05/2016 CLINICAL DATA:  Found unresponsive. Code stroke. Last seen normal at 9 a.m. EXAM: CT HEAD WITHOUT CONTRAST CT CERVICAL SPINE WITHOUT CONTRAST TECHNIQUE: Multidetector CT imaging of the head and cervical spine was performed following the standard protocol without intravenous contrast.  Multiplanar CT image reconstructions of the cervical spine were also generated. COMPARISON:  None. FINDINGS: CT HEAD FINDINGS Brain: A large right hemispheric hemorrhage measures 5.7 x 4.5 x 8.1 cm (volume = 110 cm^3). There is significant mass effect with effacement of the sulci midline shift measures least 14 mm. There is effacement of the right lateral ventricle. Intraventricular hemorrhage is seen bilaterally. Vascular: Atherosclerotic calcifications are present within the cavernous internal carotid arteries bilaterally. There is no hyperdense vessel. Skull: Calvarium is intact. No focal lytic or blastic lesions are present. Sinuses/Orbits: The paranasal sinuses and mastoid air cells are clear. The patient is status post bilateral lens replacements. The globes and orbits are otherwise intact. CT CERVICAL SPINE FINDINGS Alignment: AP alignment is anatomic. Exaggerated cervical lordosis is present. Skull base and vertebrae: The skullbase is normal. The craniocervical junction is unremarkable. Vertebral body heights are maintained. No acute fracture or traumatic subluxation is present. Soft tissues and spinal canal: A dominant left thyroid nodule measures 1.2 cm. Atherosclerotic calcifications are present at the carotid bifurcations bilaterally. No significant adenopathy is present. Disc levels: Uncovertebral spurring an osseous foraminal narrowing is present bilaterally at C5-6 and C6-7. More mild uncovertebral spurring is present at C4-5. Upper chest: Biapical scarring is present. There is some bronchiectasis. The superior mediastinum is unremarkable. IMPRESSION: 1. Large intraparenchymal hemorrhage on the right measures 5.7 x 4.5 x 8.1 cm. Estimated volume is 110 cm^3. 2. Significant mass effect with effacement of the sulci and right lateral ventricle and 14 mm right-to-left midline shift. 3. Intraventricular extension of hemorrhage. 4. Generalized atrophy and white matter disease bilaterally is otherwise  stable. 5. Multilevel degenerative changes in the cervical spine with exaggerated cervical lordosis. These results were called by telephone at  the time of interpretation on 12/05/2016 at 2:51 pm to Dr. Ritta SlotMCNEILL KIRKPATRICK , who verbally acknowledged these results. Electronically Signed   By: Marin Robertshristopher  Mattern M.D.   On: 12/05/2016 14:57    Microbiology: No results found for this or any previous visit (from the past 240 hour(s)).   Labs: Basic Metabolic Panel:  Recent Labs Lab 12/05/16 1427 12/05/16 1430  NA 134* 136  K 3.8 3.7  CL 98* 97*  CO2 25  --   GLUCOSE 146* 144*  BUN 11 14  CREATININE 0.77 0.70  CALCIUM 9.7  --    Liver Function Tests:  Recent Labs Lab 12/05/16 1427  AST 28  ALT 14  ALKPHOS 51  BILITOT 0.7  PROT 7.0  ALBUMIN 4.1   No results for input(s): LIPASE, AMYLASE in the last 168 hours. No results for input(s): AMMONIA in the last 168 hours. CBC:  Recent Labs Lab 12/05/16 1427 12/05/16 1430  WBC 10.6*  --   NEUTROABS 9.3*  --   HGB 12.1 11.9*  HCT 36.8 35.0*  MCV 90.0  --   PLT 256  --    Cardiac Enzymes: No results for input(s): CKTOTAL, CKMB, CKMBINDEX, TROPONINI in the last 168 hours. BNP: BNP (last 3 results) No results for input(s): BNP in the last 8760 hours.  ProBNP (last 3 results) No results for input(s): PROBNP in the last 8760 hours.  CBG:  Recent Labs Lab 12/05/16 1428  GLUCAP 137*       Signed:  Edsel PetrinMIKHAIL, Tennyson Kallen  [facility] 12/09/2016, 2:22 PM

## 2016-12-09 NOTE — Progress Notes (Signed)
Palliative Medicine RN Note: [facility] referral dated 12/24 noted in chart; spoke with rep; they have not rec'd referral. SW for 6N today is Mitzi DavenportShelby [phone]; left message for her requesting follow up/offer of choice.  Margret ChanceMelanie G. Soleil Mas, RN, BSN, [facility] 12/09/2016 10:52 AM Cell [phone] 8:00-4:00 Monday-Friday Office [phone]

## 2016-12-09 NOTE — Progress Notes (Signed)
[facility] Liaison  Received request from [place], [place], for family interest in [facility].  Chart reviewed.  Met briefly with patient.  Met with patient's daughter, Lattie Haw and patient's daughter-in-law, to confirm interest and explain services.  Family agreeable to transfer today.  CSW aware.  Registration paperwork completed and Dr. Orpah Melter to assume care, per family request.    Please fax discharge summary to [phone].  RN:  Please call report to [phone].    Thank you, Efrain Sella, RN, BSN [facility] Liaisons are now on AMION.  You may reach me at [phone] before 5 pm today.  [facility] 24 hour number is [phone].

## 2016-12-15 DEATH — deceased

## 2018-03-09 IMAGING — CT CT CERVICAL SPINE W/O CM
3 of 8 series · 7 of 33 positions shown, 8 images · non-contrast
Comparison: None.

CLINICAL DATA: Found unresponsive. Code stroke. Last seen normal at
9 a.m..

EXAM:
CT HEAD WITHOUT CONTRAST
CT CERVICAL SPINE WITHOUT CONTRAST
TECHNIQUE: Multidetector CT imaging of the head and cervical spine was
performed following the standard protocol without intravenous
contrast. Multiplanar CT image reconstructions of the cervical spine
were also generated.

[Series 304: axial · axial · 0.31mm/px · z∈[+126,+184]mm · 2 of 92 slices shown, 3 images]
[im 31/92  soft-tissue]
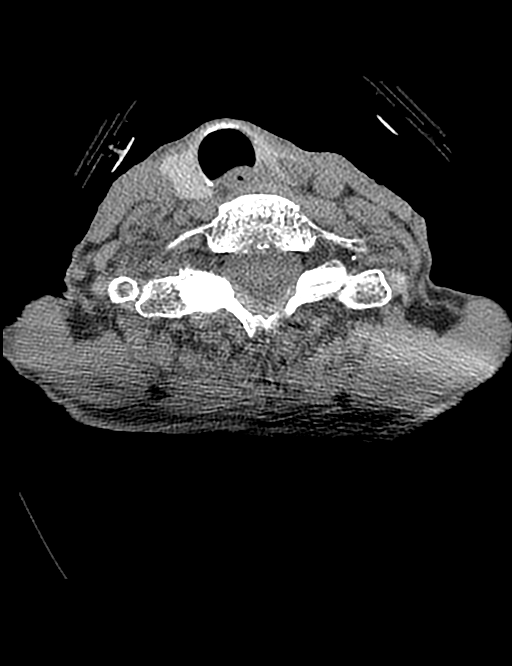
[im 31/92  bone]
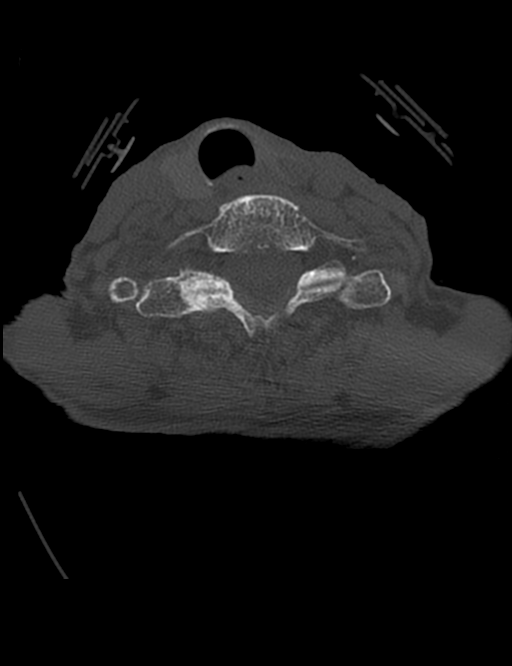
[im 61/92  bone]
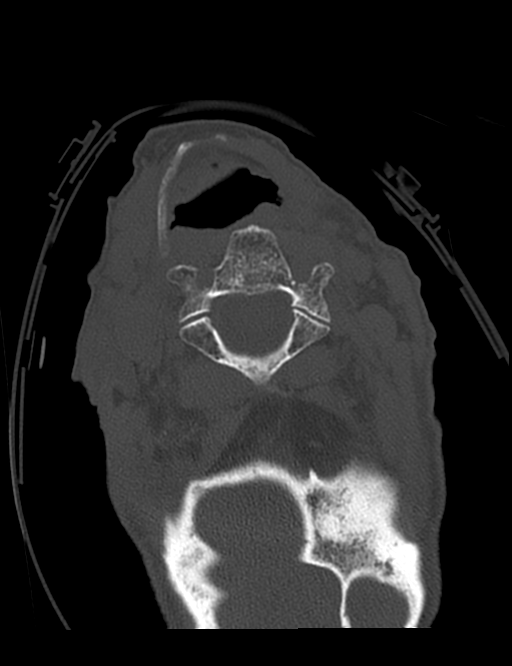

[Series 305: coronal · coronal · 0.31mm/px · 1 of 59 slices shown]
[im 30/59  bone]
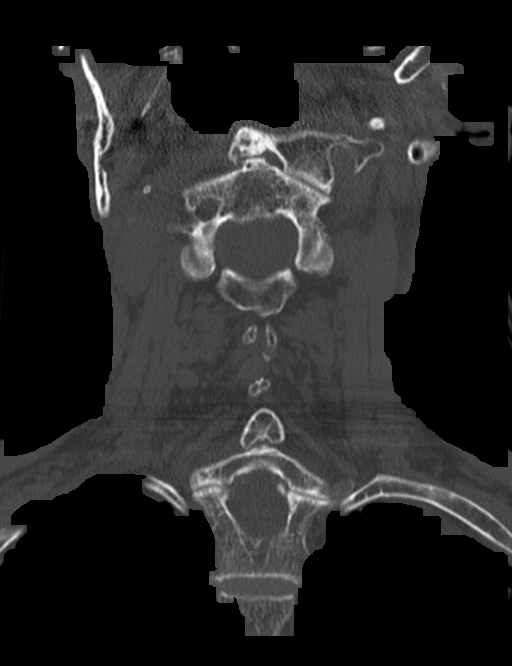

[Series 306: sagittal · sagittal · 0.31mm/px · 4 of 56 slices shown]
[im 12/56  bone]
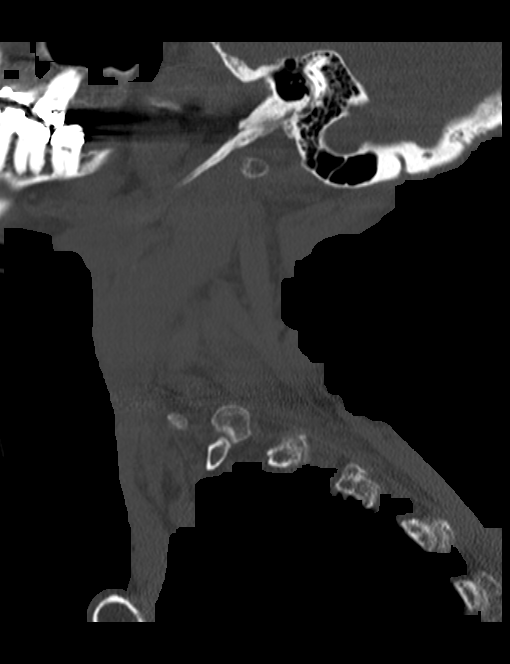
[im 23/56  bone]
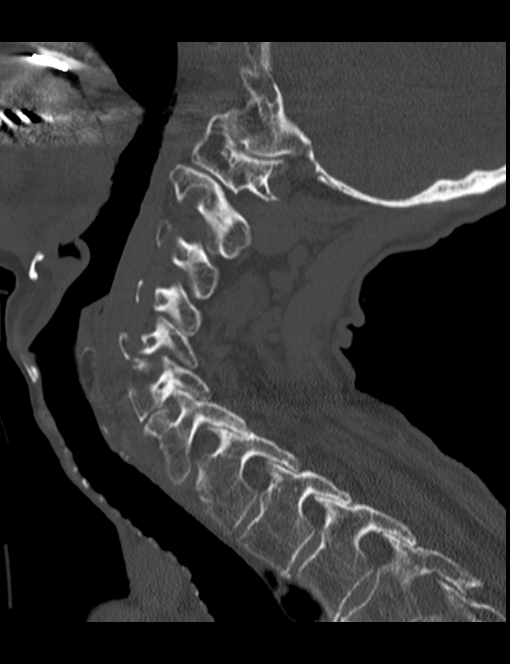
[im 34/56  bone]
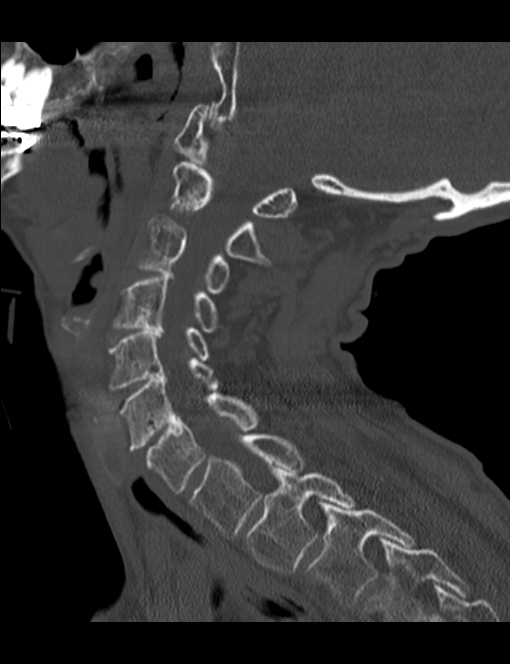
[im 45/56  bone]
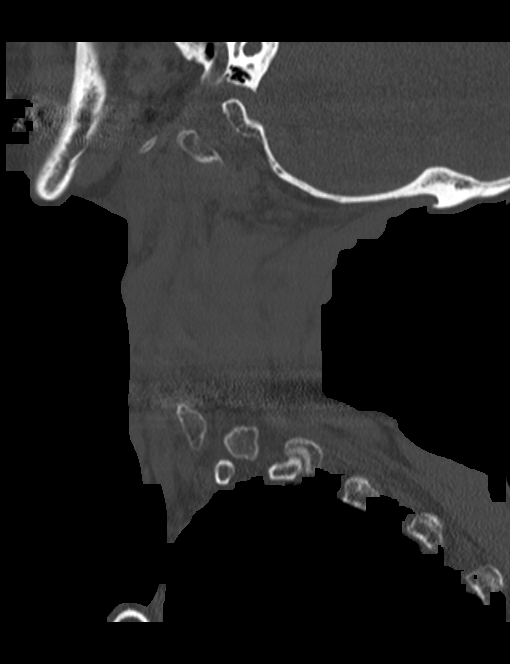

[7 of 33 positions shown; findings below may reference images not displayed]

FINDINGS: CT HEAD FINDINGS

Brain: A large right hemispheric hemorrhage measures 5.7 x 4.5 x
cm (volume = 110 cm^3). There is significant mass effect with
effacement of the sulci midline shift measures least 14 mm. There is
effacement of the right lateral ventricle. Intraventricular
hemorrhage is seen bilaterally.

Vascular: Atherosclerotic calcifications are present within the
cavernous internal carotid arteries bilaterally. There is no
hyperdense vessel.

Skull: Calvarium is intact. No focal lytic or blastic lesions are
present.

Sinuses/Orbits: The paranasal sinuses and mastoid air cells are
clear. The patient is status post bilateral lens replacements. The
globes and orbits are otherwise intact.

CT CERVICAL SPINE FINDINGS

Alignment: AP alignment is anatomic. Exaggerated cervical lordosis
is present.

Skull base and vertebrae: The skullbase is normal. The
craniocervical junction is unremarkable. Vertebral body heights are
maintained. No acute fracture or traumatic subluxation is present.

Soft tissues and spinal canal: A dominant left thyroid nodule
measures 1.2 cm. Atherosclerotic calcifications are present at the
carotid bifurcations bilaterally. No significant adenopathy is
present.

Disc levels: Uncovertebral spurring an osseous foraminal narrowing
is present bilaterally at C5-6 and C6-7. More mild uncovertebral
spurring is present at C4-5.

Upper chest: Biapical scarring is present. There is some
bronchiectasis. The superior mediastinum is unremarkable.
IMPRESSION: 1. Large intraparenchymal hemorrhage on the right measures 5.7 x
x 8.1 cm. Estimated volume is 110 cm^3.

2. Significant mass effect with effacement of the sulci and right
lateral ventricle and 14 mm right-to-left midline shift.
3. Intraventricular extension of hemorrhage.
4. Generalized atrophy and white matter disease bilaterally is
otherwise stable.
5. Multilevel degenerative changes in the cervical spine with
exaggerated cervical lordosis.

These results were called by telephone at the time of interpretation
on 12/05/2016 at [DATE] to Dr. NATALIYA MUSHTAQ , who verbally
acknowledged these results.
